# Patient Record
Sex: Female | Born: 1990 | Race: White | Hispanic: No | Marital: Married | Smoking: Never smoker
Health system: Southern US, Community
[De-identification: ages and names within clinical notes are randomized; demographics above are authoritative.]

## PROBLEM LIST (undated history)

## (undated) ENCOUNTER — Inpatient Hospital Stay (HOSPITAL_COMMUNITY): Payer: Self-pay

## (undated) DIAGNOSIS — N200 Calculus of kidney: Secondary | ICD-10-CM

## (undated) DIAGNOSIS — O139 Gestational [pregnancy-induced] hypertension without significant proteinuria, unspecified trimester: Secondary | ICD-10-CM

## (undated) DIAGNOSIS — Z789 Other specified health status: Secondary | ICD-10-CM

## (undated) HISTORY — PX: MOUTH SURGERY: SHX715

## (undated) HISTORY — DX: Gestational (pregnancy-induced) hypertension without significant proteinuria, unspecified trimester: O13.9

---
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
## 2010-05-11 ENCOUNTER — Inpatient Hospital Stay (HOSPITAL_COMMUNITY): Admission: AD | Admit: 2010-05-11 | Discharge: 2010-05-11 | Payer: Self-pay | Admitting: Obstetrics & Gynecology

## 2010-05-11 ENCOUNTER — Ambulatory Visit: Payer: Self-pay | Admitting: Advanced Practice Midwife

## 2010-08-27 ENCOUNTER — Inpatient Hospital Stay (HOSPITAL_COMMUNITY): Admission: AD | Admit: 2010-08-27 | Discharge: 2010-08-28 | Payer: Self-pay | Admitting: Obstetrics and Gynecology

## 2010-10-02 ENCOUNTER — Inpatient Hospital Stay (HOSPITAL_COMMUNITY): Admission: AD | Admit: 2010-10-02 | Discharge: 2010-10-02 | Payer: Self-pay | Admitting: Obstetrics and Gynecology

## 2010-10-07 ENCOUNTER — Inpatient Hospital Stay (HOSPITAL_COMMUNITY): Admission: AD | Admit: 2010-10-07 | Discharge: 2010-10-07 | Payer: Self-pay | Admitting: Obstetrics and Gynecology

## 2010-10-15 ENCOUNTER — Inpatient Hospital Stay (HOSPITAL_COMMUNITY)
Admission: AD | Admit: 2010-10-15 | Discharge: 2010-10-15 | Payer: Self-pay | Source: Home / Self Care | Admitting: Obstetrics and Gynecology

## 2010-10-24 ENCOUNTER — Inpatient Hospital Stay (HOSPITAL_COMMUNITY)
Admission: AD | Admit: 2010-10-24 | Discharge: 2010-10-24 | Payer: Self-pay | Source: Home / Self Care | Admitting: Obstetrics and Gynecology

## 2010-10-28 ENCOUNTER — Inpatient Hospital Stay (HOSPITAL_COMMUNITY): Admission: AD | Admit: 2010-10-28 | Discharge: 2010-10-30 | Payer: Self-pay | Admitting: Obstetrics & Gynecology

## 2011-02-07 LAB — CBC
HCT: 35.2 % — ABNORMAL LOW (ref 36.0–46.0)
Hemoglobin: 12 g/dL (ref 12.0–15.0)
MCH: 29.8 pg (ref 26.0–34.0)
MCH: 29.9 pg (ref 26.0–34.0)
MCHC: 33.3 g/dL (ref 30.0–36.0)
MCHC: 34 g/dL (ref 30.0–36.0)
MCV: 87.8 fL (ref 78.0–100.0)
MCV: 89.3 fL (ref 78.0–100.0)
Platelets: 224 10*3/uL (ref 150–400)
Platelets: 267 10*3/uL (ref 150–400)
RBC: 3.57 MIL/uL — ABNORMAL LOW (ref 3.87–5.11)
RBC: 4 MIL/uL (ref 3.87–5.11)
RDW: 14.3 % (ref 11.5–15.5)
WBC: 11.4 10*3/uL — ABNORMAL HIGH (ref 4.0–10.5)

## 2011-02-07 LAB — URINE MICROSCOPIC-ADD ON

## 2011-02-07 LAB — URINALYSIS, ROUTINE W REFLEX MICROSCOPIC
Bilirubin Urine: NEGATIVE
Glucose, UA: NEGATIVE mg/dL
Ketones, ur: NEGATIVE mg/dL
Nitrite: NEGATIVE
Protein, ur: NEGATIVE mg/dL
pH: 7.5 (ref 5.0–8.0)

## 2011-02-07 LAB — RPR: RPR Ser Ql: NONREACTIVE

## 2011-02-09 LAB — URINALYSIS, ROUTINE W REFLEX MICROSCOPIC
Glucose, UA: NEGATIVE mg/dL
Nitrite: NEGATIVE
Specific Gravity, Urine: 1.01 (ref 1.005–1.030)
pH: 6 (ref 5.0–8.0)

## 2011-02-09 LAB — URINE MICROSCOPIC-ADD ON

## 2011-02-09 LAB — URINE CULTURE

## 2011-02-12 LAB — POCT PREGNANCY, URINE: Preg Test, Ur: POSITIVE

## 2011-02-12 LAB — URINALYSIS, ROUTINE W REFLEX MICROSCOPIC
Ketones, ur: NEGATIVE mg/dL
Nitrite: NEGATIVE
Specific Gravity, Urine: 1.01 (ref 1.005–1.030)
pH: 7.5 (ref 5.0–8.0)

## 2011-03-04 LAB — URINALYSIS, ROUTINE W REFLEX MICROSCOPIC
Glucose, UA: NEGATIVE mg/dL
Ketones, ur: 15 mg/dL — AB
Protein, ur: NEGATIVE mg/dL

## 2011-03-04 LAB — URINE MICROSCOPIC-ADD ON

## 2011-03-04 LAB — RAPID STREP SCREEN (MED CTR MEBANE ONLY): Streptococcus, Group A Screen (Direct): NEGATIVE

## 2011-03-08 LAB — CBC
Hemoglobin: 11.2 g/dL — ABNORMAL LOW (ref 12.0–16.0)
MCHC: 34.1 g/dL (ref 31.0–37.0)
MCV: 91.3 fL (ref 78.0–98.0)
RBC: 3.61 MIL/uL — ABNORMAL LOW (ref 3.80–5.70)
RBC: 4 MIL/uL (ref 3.80–5.70)
WBC: 11.1 10*3/uL (ref 4.5–13.5)

## 2011-03-08 LAB — RH IMMUNE GLOB WKUP(>/=20WKS)(NOT WOMEN'S HOSP): Fetal Screen: NEGATIVE

## 2011-03-08 LAB — GLUCOSE, RANDOM: Glucose, Bld: 76 mg/dL (ref 70–99)

## 2011-03-08 LAB — RPR: RPR Ser Ql: NONREACTIVE

## 2011-03-14 LAB — COMPREHENSIVE METABOLIC PANEL
AST: 15 U/L (ref 0–37)
Albumin: 2.7 g/dL — ABNORMAL LOW (ref 3.5–5.2)
Chloride: 107 mEq/L (ref 96–112)
Creatinine, Ser: 0.39 mg/dL — ABNORMAL LOW (ref 0.4–1.2)
Potassium: 3.5 mEq/L (ref 3.5–5.1)
Total Bilirubin: 0.2 mg/dL — ABNORMAL LOW (ref 0.3–1.2)
Total Protein: 6 g/dL (ref 6.0–8.3)

## 2011-03-14 LAB — CBC
MCV: 90.4 fL (ref 78.0–98.0)
Platelets: 266 10*3/uL (ref 150–400)
RDW: 12.8 % (ref 11.4–15.5)
WBC: 9.6 10*3/uL (ref 4.5–13.5)

## 2011-03-14 LAB — URINALYSIS, ROUTINE W REFLEX MICROSCOPIC
Bilirubin Urine: NEGATIVE
Hgb urine dipstick: NEGATIVE
Protein, ur: NEGATIVE mg/dL
Urobilinogen, UA: 0.2 mg/dL (ref 0.0–1.0)

## 2011-03-14 LAB — WET PREP, GENITAL
Trich, Wet Prep: NONE SEEN
Yeast Wet Prep HPF POC: NONE SEEN

## 2011-03-14 LAB — DIFFERENTIAL
Basophils Absolute: 0 10*3/uL (ref 0.0–0.1)
Eosinophils Relative: 0 % (ref 0–5)
Lymphocytes Relative: 13 % — ABNORMAL LOW (ref 24–48)
Monocytes Absolute: 0.7 10*3/uL (ref 0.2–1.2)
Monocytes Relative: 7 % (ref 3–11)

## 2011-08-22 LAB — URINE MICROSCOPIC-ADD ON

## 2011-08-22 LAB — URINALYSIS, ROUTINE W REFLEX MICROSCOPIC
Bilirubin Urine: NEGATIVE
Glucose, UA: NEGATIVE
Protein, ur: 30 — AB

## 2012-11-07 ENCOUNTER — Inpatient Hospital Stay (HOSPITAL_COMMUNITY): Payer: BC Managed Care – PPO

## 2012-11-07 ENCOUNTER — Encounter (HOSPITAL_COMMUNITY): Payer: Self-pay

## 2012-11-07 ENCOUNTER — Inpatient Hospital Stay (HOSPITAL_COMMUNITY)
Admission: AD | Admit: 2012-11-07 | Discharge: 2012-11-07 | Disposition: A | Discharge status: Home / Self Care | Payer: BC Managed Care – PPO | Source: Ambulatory Visit | Attending: Obstetrics and Gynecology | Admitting: Obstetrics and Gynecology

## 2012-11-07 DIAGNOSIS — N390 Urinary tract infection, site not specified: Secondary | ICD-10-CM

## 2012-11-07 DIAGNOSIS — R110 Nausea: Secondary | ICD-10-CM | POA: Insufficient documentation

## 2012-11-07 DIAGNOSIS — R079 Chest pain, unspecified: Secondary | ICD-10-CM | POA: Insufficient documentation

## 2012-11-07 DIAGNOSIS — R42 Dizziness and giddiness: Secondary | ICD-10-CM | POA: Insufficient documentation

## 2012-11-07 DIAGNOSIS — R51 Headache: Secondary | ICD-10-CM | POA: Insufficient documentation

## 2012-11-07 DIAGNOSIS — N949 Unspecified condition associated with female genital organs and menstrual cycle: Secondary | ICD-10-CM | POA: Insufficient documentation

## 2012-11-07 HISTORY — DX: Other specified health status: Z78.9

## 2012-11-07 LAB — URINALYSIS, ROUTINE W REFLEX MICROSCOPIC
Ketones, ur: NEGATIVE mg/dL
Nitrite: NEGATIVE
Protein, ur: NEGATIVE mg/dL
pH: 6.5 (ref 5.0–8.0)

## 2012-11-07 MED ORDER — CIPROFLOXACIN HCL 500 MG PO TABS: 500.0000 mg | ORAL_TABLET | Freq: Two times a day (BID) | ORAL | Status: DC

## 2012-11-07 NOTE — MAU Provider Note (Signed)
History     CSN: [id number]  Arrival date and time: 11/07/12 1757   None     Chief Complaint  Patient presents with  . Pelvic Pain  . Vaginal Bleeding  . Chest Pain   HPI This is a 21 y.o. female who presents with c/o rib pain for two months, mostly on Right, 11-12th ribs.  Does not hurt more with deep breath. Hurts mostly with touch or sometimes movement. Denies asthma or other respiratory problems. Also c/o nausea for one week and lower abdominal pain which feels like "poking" in the vagina. Denies vomiting or diarrhea, Denies fever. Also has some dizziness and headache. She read that these are all side effects of the IUD.  States has had some vaginal bleeding for past month or two. Has not spoken with Dr Henderson Cloud or her family MD about any of these problems   RN Note: Pt states that pain started around 2 months ago and is in her ribs and abdomen. States that it comes and goes. States she also gets dizzy and lightheaded with the pain      OB History    Grav Para Term Preterm Abortions TAB SAB Ect Mult Living   2 2 2       2       Past Medical History  Diagnosis Date  . No pertinent past medical history     Past Surgical History  Procedure Date  . Mouth surgery     Family History  Problem Relation Age of Onset  . Cancer Mother   . Heart disease Mother   . Diabetes Mother     History  Substance Use Topics  . Smoking status: Never Smoker   . Smokeless tobacco: Not on file  . Alcohol Use: No    Allergies:  Allergies  Allergen Reactions  . Augmentin (Amoxicillin-Pot Clavulanate) Nausea And Vomiting    No prescriptions prior to admission    ROS See HPI  Physical Exam   Blood pressure 127/79, pulse 103, temperature 97.8 F (36.6 C), temperature source Oral, resp. rate 20.  Physical Exam  Constitutional: She is oriented to person, place, and time. She appears well-developed and well-nourished. No distress.  HENT:  Head: Normocephalic.   Cardiovascular: Normal rate, regular rhythm and normal heart sounds.   Respiratory: Effort normal and breath sounds normal. No respiratory distress. She has no wheezes. She has no rales. She exhibits tenderness (over Right 11-12th ICS, none on left).  GI: Soft.  Genitourinary: Uterus normal. Vaginal discharge (white mucous) found.       IUD strings visible, no protrusion palpable, Cervix long and closed. Has some CMT Refuses STD testing.  Musculoskeletal: Normal range of motion.  Neurological: She is alert and oriented to person, place, and time.  Skin: Skin is warm and dry.  Psychiatric: She has a normal mood and affect.   Results for orders placed during the hospital encounter of 11/07/12 (from the past 24 hour(s))  URINALYSIS, ROUTINE W REFLEX MICROSCOPIC     Status: Abnormal   Collection Time   11/07/12  6:30 PM      Component Value Range   Color, Urine YELLOW  YELLOW   APPearance CLEAR  CLEAR   Specific Gravity, Urine 1.020  1.005 - 1.030   pH 6.5  5.0 - 8.0   Glucose, UA NEGATIVE  NEGATIVE mg/dL   Hgb urine dipstick MODERATE (*) NEGATIVE   Bilirubin Urine NEGATIVE  NEGATIVE   Ketones, ur NEGATIVE  NEGATIVE mg/dL  Protein, ur NEGATIVE  NEGATIVE mg/dL   Urobilinogen, UA 0.2  0.0 - 1.0 mg/dL   Nitrite NEGATIVE  NEGATIVE   Leukocytes, UA LARGE (*) NEGATIVE  URINE MICROSCOPIC-ADD ON     Status: Abnormal   Collection Time   11/07/12  6:30 PM      Component Value Range   Squamous Epithelial / LPF FEW (*) RARE   WBC, UA 21-50  <3 WBC/hpf   RBC / HPF 11-20  <3 RBC/hpf   Bacteria, UA FEW (*) RARE  POCT PREGNANCY, URINE     Status: Normal   Collection Time   11/07/12  7:01 PM      Component Value Range   Preg Test, Ur NEGATIVE  NEGATIVE    MAU Course  Procedures  MDM Dr Henderson Cloud paged at 1950hrs.  Assessment and Plan  A:  Pelvic pain      Right rib pain      Nausea for one week      Probable UTI  P:  Will culture urine and treat presumptively for UTI       Will  check IUD with [place]  [facility] 11/07/2012, 7:49 PM   Addendum Assumed care at 2130 Clinical Data: Pelvic pain. IUD placement.  TRANSABDOMINAL AND TRANSVAGINAL ULTRASOUND OF PELVIS  Technique: Both transabdominal and transvaginal ultrasound  examinations of the pelvis were performed. Transabdominal technique  was performed for global imaging of the pelvis including uterus,  ovaries, adnexal regions, and pelvic cul-de-sac.  It was necessary to proceed with endovaginal exam following the  transabdominal exam to visualize the endometrium and ovaries.  Comparison: None  Findings:  Uterus: The uterus is anteverted and measures 8.1 x 3.7 x 5.1 cm.  No focal myometrial mass lesions.  Endometrium: Endometrial stripe thickness measures 4 mm. The  echogenic structure within the endometrium consistent with  intrauterine device. Positioning of the intrauterine device  appears typical. No abnormal endometrial fluid collections.  Right ovary: The right ovary measures 4.2 x 3 x 3.3 cm. Normal  follicular changes. Flow is demonstrated in the right ovary on  color flow Doppler imaging.  Left ovary: Left ovary measures 3.6 x 1.7 x 1.8 cm. Normal  follicular changes. Flow is demonstrated in the left ovary on  color flow Doppler imaging.  Other findings: No free pelvic fluid collections. No abnormal  adnexal masses.  IMPRESSION:  Intrauterine device is present in typical location. Otherwise  normal study. No evidence of pelvic mass or other significant  abnormality.  Original Report Authenticated By: Burman Nieves, M.D.   P:  Reassured pt re IUD placement and normal pelvic US Rx: Cipro x3d AVS on UTI F/U as needed with Dr. Aldona Bar

## 2012-11-07 NOTE — MAU Note (Signed)
Pt states that pain started around 2 months ago and is in her ribs and abdomen. States that it comes and goes. States she also gets dizzy and lightheaded with the pain

## 2012-11-09 LAB — URINE CULTURE
Culture: NO GROWTH
Special Requests: NORMAL

## 2013-05-23 ENCOUNTER — Encounter (HOSPITAL_COMMUNITY): Payer: Self-pay | Admitting: Emergency Medicine

## 2013-05-23 DIAGNOSIS — M549 Dorsalgia, unspecified: Secondary | ICD-10-CM | POA: Insufficient documentation

## 2013-05-23 DIAGNOSIS — R300 Dysuria: Secondary | ICD-10-CM | POA: Insufficient documentation

## 2013-05-23 DIAGNOSIS — R350 Frequency of micturition: Secondary | ICD-10-CM | POA: Insufficient documentation

## 2013-05-23 DIAGNOSIS — Z3202 Encounter for pregnancy test, result negative: Secondary | ICD-10-CM | POA: Insufficient documentation

## 2013-05-23 DIAGNOSIS — N390 Urinary tract infection, site not specified: Secondary | ICD-10-CM | POA: Insufficient documentation

## 2013-05-23 LAB — URINALYSIS, ROUTINE W REFLEX MICROSCOPIC
Bilirubin Urine: NEGATIVE
Ketones, ur: NEGATIVE mg/dL
Nitrite: NEGATIVE
Protein, ur: 30 mg/dL — AB
Urobilinogen, UA: 1 mg/dL (ref 0.0–1.0)

## 2013-05-23 LAB — URINE MICROSCOPIC-ADD ON

## 2013-05-23 NOTE — ED Notes (Signed)
C/o intermittent bilateral flank pain since Wednesday and vaginal pressure after urinating.  Reports mid lower back pain today.

## 2013-05-24 ENCOUNTER — Emergency Department (HOSPITAL_COMMUNITY)
Admission: EM | Admit: 2013-05-24 | Discharge: 2013-05-24 | Disposition: A | Discharge status: Home / Self Care | Payer: BC Managed Care – PPO | Attending: Emergency Medicine | Admitting: Emergency Medicine

## 2013-05-24 ENCOUNTER — Emergency Department (HOSPITAL_COMMUNITY): Payer: BC Managed Care – PPO

## 2013-05-24 DIAGNOSIS — M549 Dorsalgia, unspecified: Secondary | ICD-10-CM

## 2013-05-24 DIAGNOSIS — N390 Urinary tract infection, site not specified: Secondary | ICD-10-CM

## 2013-05-24 MED ORDER — NITROFURANTOIN MONOHYD MACRO 100 MG PO CAPS: 100.0000 mg | ORAL_CAPSULE | Freq: Two times a day (BID) | ORAL | Status: DC

## 2013-05-24 MED ORDER — NITROFURANTOIN MONOHYD MACRO 100 MG PO CAPS
100.0000 mg | ORAL_CAPSULE | Freq: Once | ORAL | Status: AC
  Administered 2013-05-24: 100 mg via ORAL
  Filled 2013-05-24: qty 1

## 2013-05-24 MED ORDER — IBUPROFEN 800 MG PO TABS: 800.0000 mg | ORAL_TABLET | Freq: Three times a day (TID) | ORAL | Status: DC

## 2013-05-24 MED ORDER — KETOROLAC TROMETHAMINE 60 MG/2ML IM SOLN
60.0000 mg | Freq: Once | INTRAMUSCULAR | Status: AC
  Administered 2013-05-24: 60 mg via INTRAMUSCULAR
  Filled 2013-05-24: qty 2

## 2013-05-24 NOTE — ED Provider Notes (Addendum)
History    CSN: [id number] Arrival date & time 05/23/13  2255  First MD Initiated Contact with Patient 05/24/13 0100     Chief Complaint  Patient presents with  . Flank Pain  . Back Pain   (Consider location/radiation/quality/duration/timing/severity/associated sxs/prior Treatment) HPI Hx per PT - L flank pain since yesterday with associated dysuria and frequency. No F/C. No N/V/D. No h/o same, no hematuria but has noticed dark urine. Symptoms MOD in severity.   Past Medical History  Diagnosis Date  . No pertinent past medical history    Past Surgical History  Procedure Laterality Date  . Mouth surgery     Family History  Problem Relation Age of Onset  . Cancer Mother   . Heart disease Mother   . Diabetes Mother    History  Substance Use Topics  . Smoking status: Never Smoker   . Smokeless tobacco: Not on file  . Alcohol Use: No   OB History   Grav Para Term Preterm Abortions TAB SAB Ect Mult Living   2 2 2       2      Review of Systems  Constitutional: Negative for fever and chills.  HENT: Negative for neck pain and neck stiffness.   Eyes: Negative for pain.  Respiratory: Negative for shortness of breath.   Cardiovascular: Negative for chest pain.  Gastrointestinal: Negative for abdominal pain.  Genitourinary: Positive for flank pain.  Musculoskeletal: Negative for back pain.  Skin: Negative for rash.  Neurological: Negative for headaches.  All other systems reviewed and are negative.    Allergies  Augmentin  Home Medications   Current Outpatient Rx  Name  Route  Sig  Dispense  Refill  . levonorgestrel (MIRENA) 20 MCG/24HR IUD   Intrauterine   1 each by Intrauterine route once.          BP 145/94  Pulse 104  Temp(Src) 98.4 F (36.9 C) (Oral)  Resp 16  SpO2 97% Physical Exam  Constitutional: She is oriented to person, place, and time. She appears well-developed and well-nourished.  HENT:  Head: Normocephalic and atraumatic.  Eyes: EOM  are normal. Pupils are equal, round, and reactive to light.  Neck: Neck supple.  Cardiovascular: Normal rate, regular rhythm and intact distal pulses.   Pulmonary/Chest: Effort normal and breath sounds normal. No respiratory distress.  Abdominal: Soft. Bowel sounds are normal. She exhibits no distension. There is no tenderness.  No CVAT  Musculoskeletal: Normal range of motion. She exhibits no edema.  Neurological: She is alert and oriented to person, place, and time.  Skin: Skin is warm and dry.    ED Course  Procedures (including critical care time)  Results for orders placed during the hospital encounter of 05/24/13  URINALYSIS, ROUTINE W REFLEX MICROSCOPIC      Result Value Range   Color, Urine YELLOW  YELLOW   APPearance CLOUDY (*) CLEAR   Specific Gravity, Urine 1.027  1.005 - 1.030   pH 6.0  5.0 - 8.0   Glucose, UA NEGATIVE  NEGATIVE mg/dL   Hgb urine dipstick MODERATE (*) NEGATIVE   Bilirubin Urine NEGATIVE  NEGATIVE   Ketones, ur NEGATIVE  NEGATIVE mg/dL   Protein, ur 30 (*) NEGATIVE mg/dL   Urobilinogen, UA 1.0  0.0 - 1.0 mg/dL   Nitrite NEGATIVE  NEGATIVE   Leukocytes, UA LARGE (*) NEGATIVE  URINE MICROSCOPIC-ADD ON      Result Value Range   Squamous Epithelial / LPF RARE  RARE  WBC, UA 21-50  <3 WBC/hpf   RBC / HPF 0-2  <3 RBC/hpf   Bacteria, UA RARE  RARE  POCT PREGNANCY, URINE      Result Value Range   Preg Test, Ur NEGATIVE  NEGATIVE   Ct Abdomen Pelvis Wo Contrast  05/24/2013   *RADIOLOGY REPORT*  Clinical Data:  Intermittent bilateral flank pain since Wednesday. Vaginal pressure after urinating.  Left flank pain.  CT ABDOMEN AND PELVIS WITHOUT CONTRAST (CT UROGRAM)  Technique: Contiguous axial images of the abdomen and pelvis without oral or intravenous contrast were obtained.  Comparison: Pelvic ultrasound 11/07/2012.  No prior CT.  Findings:  Exam is limited for evaluation of entities other than urinary tract calculi due to lack of oral or intravenous  contrast.   Lung bases:  Normal  Abdomen/pelvis:  Normal uninfused appearance of the liver, spleen, stomach, pancreas, gallbladder, biliary tract, adrenal glands.  Bilateral renal collecting system calculi.  Low density foci within both kidneys which are likely cysts. No hydronephrosis.  No hydroureter or ureteric calculi.  No retroperitoneal or retrocrural adenopathy.  Normal terminal ileum and appendix.  Normal small bowel without abdominal ascites.  No pelvic adenopathy.  Normal urinary bladder.  Intrauterine device. No adnexal mass or significant free fluid.  Bones/Musculoskeletal:  No acute osseous abnormality.  IMPRESSION:  1.  Bilateral nephrolithiasis.  No urinary tract obstruction or ureteric stone. 2.  Low-density renal lesions which are likely cysts.   Original Report Authenticated By: Jeronimo Greaves, M.D.    toradol and first dose ABx provided  Plan d/c home, UTI precautions RX ABx and outpatient follow up  MDM  UTI  Labs, imaging, UA  Medications provided  VS, nursing notes reviewed  Sunnie Nielsen, MD 05/24/13 [id number]  Sunnie Nielsen, MD 06/04/13 5409

## 2013-07-11 ENCOUNTER — Ambulatory Visit: Payer: Self-pay | Admitting: Family Medicine

## 2013-07-14 ENCOUNTER — Ambulatory Visit (INDEPENDENT_AMBULATORY_CARE_PROVIDER_SITE_OTHER): Payer: BC Managed Care – PPO | Admitting: Family Medicine

## 2013-07-14 ENCOUNTER — Encounter: Payer: Self-pay | Admitting: Family Medicine

## 2013-07-14 VITALS — BP 100/68 | HR 78 | Temp 98.6°F | Resp 16 | Wt 118.0 lb

## 2013-07-14 DIAGNOSIS — J020 Streptococcal pharyngitis: Secondary | ICD-10-CM

## 2013-07-14 LAB — RAPID STREP SCREEN (MED CTR MEBANE ONLY): Streptococcus, Group A Screen (Direct): NEGATIVE

## 2013-07-14 MED ORDER — AZITHROMYCIN 250 MG PO TABS: ORAL_TABLET | ORAL | Status: DC

## 2013-07-14 NOTE — Progress Notes (Signed)
  Subjective:    Patient ID: Jill Vaughn, female    DOB: 08/03/1991, 22 y.o.   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  She now presents with days of severe sore throat on the left side. Left otalgia. Left tender cervical lymphadenopathy. Subjective fevers. She denies any rhinorrhea or coughing. She denies any nausea vomiting or diarrhea. She denies any rash. Past Medical History  Diagnosis Date  . No pertinent past medical history    Current Outpatient Prescriptions on File Prior to Visit  Medication Sig Dispense Refill  . levonorgestrel (MIRENA) 20 MCG/24HR IUD 1 each by Intrauterine route once.       No current facility-administered medications on file prior to visit.   Allergies  Allergen Reactions  . Augmentin [Amoxicillin-Pot Clavulanate] Nausea And Vomiting   History   Social History  . Marital Status: Legally Separated    Spouse Name: N/A    Number of Children: N/A  . Years of Education: N/A   Occupational History  . Not on file.   Social History Main Topics  . Smoking status: Never Smoker   . Smokeless tobacco: Not on file  . Alcohol Use: No  . Drug Use: No  . Sexual Activity: Yes    Birth Control/ Protection: IUD   Other Topics Concern  . Not on file   Social History Narrative  . No narrative on file      Review of Systems  All other systems reviewed and are negative.       Objective:   Physical Exam  Vitals reviewed. HENT:  Right Ear: External ear normal.  Left Ear: External ear normal.  Nose: Nose normal.  Mouth/Throat: Oropharyngeal exudate, posterior oropharyngeal edema and posterior oropharyngeal erythema present.  Neck: Neck supple. No thyromegaly present.  Cardiovascular: Normal rate, regular rhythm and normal heart sounds.   No murmur heard. Pulmonary/Chest: Effort normal and breath sounds normal. No respiratory distress. She has no wheezes. She has no rales.   Abdominal: Soft. Bowel sounds are normal. She exhibits no distension. There is no tenderness. There is no rebound.  Lymphadenopathy:    She has cervical adenopathy (Tender left anterior cervical lymphadenopathy).   Left tonsil is red and swollen.       Assessment & Plan:  Clinically the patient has strep throat.- Begin azithromycin 500 mg by mouth daily 1 and 250 mg by mouth daily 2-5. Recheck in 48 hours if no better or sooner if worse.

## 2014-07-19 ENCOUNTER — Emergency Department (HOSPITAL_COMMUNITY)
Admission: EM | Admit: 2014-07-19 | Discharge: 2014-07-19 | Disposition: A | Discharge status: Home / Self Care | Payer: BC Managed Care – PPO | Attending: Emergency Medicine | Admitting: Emergency Medicine

## 2014-07-19 ENCOUNTER — Encounter (HOSPITAL_COMMUNITY): Payer: Self-pay | Admitting: Emergency Medicine

## 2014-07-19 DIAGNOSIS — N760 Acute vaginitis: Secondary | ICD-10-CM | POA: Diagnosis not present

## 2014-07-19 DIAGNOSIS — Z3202 Encounter for pregnancy test, result negative: Secondary | ICD-10-CM | POA: Diagnosis not present

## 2014-07-19 DIAGNOSIS — B9689 Other specified bacterial agents as the cause of diseases classified elsewhere: Secondary | ICD-10-CM | POA: Insufficient documentation

## 2014-07-19 DIAGNOSIS — R102 Pelvic and perineal pain: Secondary | ICD-10-CM

## 2014-07-19 DIAGNOSIS — N949 Unspecified condition associated with female genital organs and menstrual cycle: Secondary | ICD-10-CM | POA: Insufficient documentation

## 2014-07-19 DIAGNOSIS — Z87442 Personal history of urinary calculi: Secondary | ICD-10-CM | POA: Insufficient documentation

## 2014-07-19 DIAGNOSIS — R109 Unspecified abdominal pain: Secondary | ICD-10-CM | POA: Insufficient documentation

## 2014-07-19 DIAGNOSIS — A499 Bacterial infection, unspecified: Secondary | ICD-10-CM | POA: Insufficient documentation

## 2014-07-19 DIAGNOSIS — Z880 Allergy status to penicillin: Secondary | ICD-10-CM | POA: Insufficient documentation

## 2014-07-19 HISTORY — DX: Calculus of kidney: N20.0

## 2014-07-19 LAB — URINALYSIS, ROUTINE W REFLEX MICROSCOPIC
Bilirubin Urine: NEGATIVE
GLUCOSE, UA: NEGATIVE mg/dL
KETONES UR: NEGATIVE mg/dL
Nitrite: NEGATIVE
PH: 5.5 (ref 5.0–8.0)
PROTEIN: NEGATIVE mg/dL
Specific Gravity, Urine: >1.03 — ABNORMAL HIGH (ref 1.005–1.030)
Urobilinogen, UA: 0.2 mg/dL (ref 0.0–1.0)

## 2014-07-19 LAB — URINE MICROSCOPIC-ADD ON

## 2014-07-19 LAB — CBC WITH DIFFERENTIAL/PLATELET
Basophils Absolute: 0 10*3/uL (ref 0.0–0.1)
Basophils Relative: 0 % (ref 0–1)
Eosinophils Absolute: 0 10*3/uL (ref 0.0–0.7)
Eosinophils Relative: 0 % (ref 0–5)
HEMATOCRIT: 41.2 % (ref 36.0–46.0)
HEMOGLOBIN: 14.2 g/dL (ref 12.0–15.0)
LYMPHS PCT: 17 % (ref 12–46)
Lymphs Abs: 1.1 10*3/uL (ref 0.7–4.0)
MCH: 31 pg (ref 26.0–34.0)
MCHC: 34.5 g/dL (ref 30.0–36.0)
MCV: 90 fL (ref 78.0–100.0)
MONO ABS: 0.5 10*3/uL (ref 0.1–1.0)
MONOS PCT: 7 % (ref 3–12)
NEUTROS ABS: 4.9 10*3/uL (ref 1.7–7.7)
Neutrophils Relative %: 76 % (ref 43–77)
Platelets: 248 10*3/uL (ref 150–400)
RBC: 4.58 MIL/uL (ref 3.87–5.11)
RDW: 12.3 % (ref 11.5–15.5)
WBC: 6.5 10*3/uL (ref 4.0–10.5)

## 2014-07-19 LAB — COMPREHENSIVE METABOLIC PANEL
ALT: 12 U/L (ref 0–35)
ANION GAP: 12 (ref 5–15)
AST: 14 U/L (ref 0–37)
Albumin: 4 g/dL (ref 3.5–5.2)
Alkaline Phosphatase: 83 U/L (ref 39–117)
BUN: 9 mg/dL (ref 6–23)
CO2: 25 mEq/L (ref 19–32)
Calcium: 9.3 mg/dL (ref 8.4–10.5)
Chloride: 104 mEq/L (ref 96–112)
Creatinine, Ser: 0.7 mg/dL (ref 0.50–1.10)
GFR calc non Af Amer: >90 mL/min (ref >90)
GLUCOSE: 85 mg/dL (ref 70–99)
Potassium: 4.3 mEq/L (ref 3.7–5.3)
Sodium: 141 mEq/L (ref 137–147)
Total Bilirubin: 0.4 mg/dL (ref 0.3–1.2)
Total Protein: 7.3 g/dL (ref 6.0–8.3)

## 2014-07-19 LAB — WET PREP, GENITAL
Trich, Wet Prep: NONE SEEN
YEAST WET PREP: NONE SEEN

## 2014-07-19 LAB — PREGNANCY, URINE: Preg Test, Ur: NEGATIVE

## 2014-07-19 LAB — LIPASE, BLOOD: Lipase: 57 U/L (ref 11–59)

## 2014-07-19 MED ORDER — KETOROLAC TROMETHAMINE 60 MG/2ML IM SOLN
60.0000 mg | Freq: Once | INTRAMUSCULAR | Status: AC
  Administered 2014-07-19: 60 mg via INTRAMUSCULAR
  Filled 2014-07-19: qty 2

## 2014-07-19 MED ORDER — CEFTRIAXONE SODIUM 250 MG IJ SOLR
250.0000 mg | Freq: Once | INTRAMUSCULAR | Status: AC
  Administered 2014-07-19: 250 mg via INTRAMUSCULAR
  Filled 2014-07-19: qty 250

## 2014-07-19 MED ORDER — ONDANSETRON HCL 4 MG PO TABS
4.0000 mg | ORAL_TABLET | Freq: Once | ORAL | Status: AC
  Administered 2014-07-19: 4 mg via ORAL
  Filled 2014-07-19: qty 1

## 2014-07-19 MED ORDER — ONDANSETRON HCL 4 MG PO TABS: 4.0000 mg | ORAL_TABLET | Freq: Four times a day (QID) | ORAL | Status: DC

## 2014-07-19 MED ORDER — METRONIDAZOLE 500 MG PO TABS: 500.0000 mg | ORAL_TABLET | Freq: Two times a day (BID) | ORAL | Status: DC

## 2014-07-19 MED ORDER — LIDOCAINE HCL (PF) 1 % IJ SOLN
INTRAMUSCULAR | Status: AC
  Administered 2014-07-19: 18:00:00
  Filled 2014-07-19: qty 5

## 2014-07-19 MED ORDER — NAPROXEN 500 MG PO TABS: 500.0000 mg | ORAL_TABLET | Freq: Two times a day (BID) | ORAL | Status: DC

## 2014-07-19 NOTE — Discharge Instructions (Signed)
Take the metronidazole until gone. Take the naproxen for pain as needed. Use the zofran for nausea or vomiting if needed. Have Dr Aldona Bar recheck you this week if you aren't improving.  Bacterial Vaginosis Bacterial vaginosis is a vaginal infection that occurs when the normal balance of bacteria in the vagina is disrupted. It results from an overgrowth of certain bacteria. This is the most common vaginal infection in women of childbearing age. Treatment is important to prevent complications, especially in pregnant women, as it can cause a premature delivery. CAUSES  Bacterial vaginosis is caused by an increase in harmful bacteria that are normally present in smaller amounts in the vagina. Several different kinds of bacteria can cause bacterial vaginosis. However, the reason that the condition develops is not fully understood. RISK FACTORS Certain activities or behaviors can put you at an increased risk of developing bacterial vaginosis, including:  Having a new sex partner or multiple sex partners.  Douching.  Using an intrauterine device (IUD) for contraception. Women do not get bacterial vaginosis from toilet seats, bedding, swimming pools, or contact with objects around them. SIGNS AND SYMPTOMS  Some women with bacterial vaginosis have no signs or symptoms. Common symptoms include:  Grey vaginal discharge.  A fishlike odor with discharge, especially after sexual intercourse.  Itching or burning of the vagina and vulva.  Burning or pain with urination. DIAGNOSIS  Your health care provider will take a medical history and examine the vagina for signs of bacterial vaginosis. A sample of vaginal fluid may be taken. Your health care provider will look at this sample under a microscope to check for bacteria and abnormal cells. A vaginal pH test may also be done.  TREATMENT  Bacterial vaginosis may be treated with antibiotic medicines. These may be given in the form of a pill or a vaginal cream. A  second round of antibiotics may be prescribed if the condition comes back after treatment.  HOME CARE INSTRUCTIONS   Only take over-the-counter or prescription medicines as directed by your health care provider.  If antibiotic medicine was prescribed, take it as directed. Make sure you finish it even if you start to feel better.  Do not have sex until treatment is completed.  Tell all sexual partners that you have a vaginal infection. They should see their health care provider and be treated if they have problems, such as a mild rash or itching.  Practice safe sex by using condoms and only having one sex partner. SEEK MEDICAL CARE IF:   Your symptoms are not improving after 3 days of treatment.  You have increased discharge or pain.  You have a fever. MAKE SURE YOU:   Understand these instructions.  Will watch your condition.  Will get help right away if you are not doing well or get worse. FOR MORE INFORMATION  Centers for Disease Control and Prevention, Division of STD Prevention: [employer]Apps[employer] American Sexual Health Association (ASHA): www.ashastd.org  Document Released: 11/13/2005 Document Revised: 09/03/2013 Document Reviewed: 06/25/2013 [facility] Patient Information 2015 [place], [place]. This information is not intended to replace advice given to you by your health care provider. Make sure you discuss any questions you have with your health care provider.

## 2014-07-19 NOTE — ED Provider Notes (Signed)
CSN: [id number]     Arrival date & time 07/19/14  1551 History   First MD Initiated Contact with Patient 07/19/14 1600     Chief Complaint  Patient presents with  . Abdominal Pain     (Consider location/radiation/quality/duration/timing/severity/associated sxs/prior Treatment) HPI Patient is G2 P2, no regular menses because of IUD placed in 2011. She reports she's been having some intermittent right lower quadrant pain off and on for the past month. She states the pain comes and lasts about 3 minutes. She states only thing she notices that makes the pain worse is driving. Nothing makes it feel better. She states when she gets the pain she wants to lay down and be still. She describes the pain as pressure and aching. She does not get the pain every day however she can have it over 20 times a day. She sometimes has nausea with it but no vomiting. She has noted frequency without dysuria or hematuria. She denies diarrhea. She has had some vaginal discharge. She denies fever or cough. She states the current pain she has started about 12 noon today and has not gone away. She rates the pain as a 6/10. She states she also gets some achiness in her right flank that is not associated with the pain in her abdomen. She states she's had this pain before with kidney stones.  PCP Dr Tanya Nones GYN Dr Aldona Bar  Past Medical History  Diagnosis Date  . No pertinent past medical history   . Kidney stones    Past Surgical History  Procedure Laterality Date  . Mouth surgery     Family History  Problem Relation Age of Onset  . Cancer Mother   . Heart disease Mother   . Diabetes Mother    History  Substance Use Topics  . Smoking status: Never Smoker   . Smokeless tobacco: Not on file  . Alcohol Use: No   unemployed  OB History   Grav Para Term Preterm Abortions TAB SAB Ect Mult Living   Review of Systems  All other systems reviewed and are negative.     Allergies   Augmentin  Home Medications   Prior to Admission medications   Medication Sig Start Date End Date Taking? Authorizing Provider  levonorgestrel (MIRENA) 20 MCG/24HR IUD 1 each by Intrauterine route once.    Historical Provider, MD   BP 117/76  Pulse 104  Temp(Src) 99.1 F (37.3 C) (Oral)  Resp 18  Ht 5' (1.524 m)  Wt 130 lb (58.968 kg)  BMI 25.39 kg/m2  SpO2 100%  Vital signs normal except for tachycardia, low grade fever  Physical Exam  Nursing note and vitals reviewed. Constitutional: She is oriented to person, place, and time. She appears well-developed and well-nourished.  Non-toxic appearance. She does not appear ill. No distress.  HENT:  Head: Normocephalic and atraumatic.  Right Ear: External ear normal.  Left Ear: External ear normal.  Nose: Nose normal. No mucosal edema or rhinorrhea.  Mouth/Throat: Oropharynx is clear and moist and mucous membranes are normal. No dental abscesses or uvula swelling.  Eyes: Conjunctivae and EOM are normal. Pupils are equal, round, and reactive to light.  Neck: Normal range of motion and full passive range of motion without pain. Neck supple.  Cardiovascular: Normal rate, regular rhythm and normal heart sounds.  Exam reveals no gallop and no friction rub.   No murmur heard. Pulmonary/Chest: Effort normal  and breath sounds normal. No respiratory distress. She has no wheezes. She has no rhonchi. She has no rales. She exhibits no tenderness and no crepitus.  Abdominal: Soft. Normal appearance and bowel sounds are normal. She exhibits no distension. There is tenderness. There is no rebound and no guarding.    Very tender in the low RLQ Bilateral flank pain  Genitourinary:  White vaginal discharge. Speculum exam is painful. IUD string was seen coming out of the cervical os. Patient is tender to palpation upper normal size uterus, she's very tender over the left adnexa but has no discomfort over the right adnexa.  Musculoskeletal: Normal  range of motion. She exhibits no edema and no tenderness.  Moves all extremities well.   Neurological: She is alert and oriented to person, place, and time. She has normal strength. No cranial nerve deficit.  Skin: Skin is warm, dry and intact. No rash noted. No erythema. No pallor.  Psychiatric: She has a normal mood and affect. Her speech is normal and behavior is normal. Her mood appears not anxious.    ED Course  Procedures (including critical care time)  Medications  ketorolac (TORADOL) injection 60 mg (60 mg Intramuscular Given 07/19/14 1638)  ondansetron (ZOFRAN) tablet 4 mg (4 mg Oral Given 07/19/14 1638)  cefTRIAXone (ROCEPHIN) injection 250 mg (250 mg Intramuscular Given 07/19/14 1736)  lidocaine (PF) (XYLOCAINE) 1 % injection (  Given 07/19/14 1738)    Review of prior studies shows patient had abdominal/pelvic CT scan done in June 2014 showing small bilateral renal stones without stones in the ureter or bladder.  Pt given toradol for pain and rocephin b/o discomfort with pelvic exam. Pt reports she has had dyspareunia also.   Labs Review Results for orders placed during the hospital encounter of 07/19/14  WET PREP, GENITAL      Result Value Ref Range   Yeast Wet Prep HPF POC NONE SEEN  NONE SEEN   Trich, Wet Prep NONE SEEN  NONE SEEN   Clue Cells Wet Prep HPF POC FEW (*) NONE SEEN   WBC, Wet Prep HPF POC TOO NUMEROUS TO COUNT (*) NONE SEEN  URINALYSIS, ROUTINE W REFLEX MICROSCOPIC      Result Value Ref Range   Color, Urine YELLOW  YELLOW   APPearance CLOUDY (*) CLEAR   Specific Gravity, Urine >1.030 (*) 1.005 - 1.030   pH 5.5  5.0 - 8.0   Glucose, UA NEGATIVE  NEGATIVE mg/dL   Hgb urine dipstick LARGE (*) NEGATIVE   Bilirubin Urine NEGATIVE  NEGATIVE   Ketones, ur NEGATIVE  NEGATIVE mg/dL   Protein, ur NEGATIVE  NEGATIVE mg/dL   Urobilinogen, UA 0.2  0.0 - 1.0 mg/dL   Nitrite NEGATIVE  NEGATIVE   Leukocytes, UA SMALL (*) NEGATIVE  PREGNANCY, URINE      Result Value  Ref Range   Preg Test, Ur NEGATIVE  NEGATIVE  CBC WITH DIFFERENTIAL      Result Value Ref Range   WBC 6.5  4.0 - 10.5 K/uL   RBC 4.58  3.87 - 5.11 MIL/uL   Hemoglobin 14.2  12.0 - 15.0 g/dL   HCT 16.1  09.6 - 04.5 %   MCV 90.0  78.0 - 100.0 fL   MCH 31.0  26.0 - 34.0 pg   MCHC 34.5  30.0 - 36.0 g/dL   RDW 40.9  81.1 - 91.4 %   Platelets 248  150 - 400 K/uL   Neutrophils Relative % 76  43 -  77 %   Neutro Abs 4.9  1.7 - 7.7 K/uL   Lymphocytes Relative 17  12 - 46 %   Lymphs Abs 1.1  0.7 - 4.0 K/uL   Monocytes Relative 7  3 - 12 %   Monocytes Absolute 0.5  0.1 - 1.0 K/uL   Eosinophils Relative 0  0 - 5 %   Eosinophils Absolute 0.0  0.0 - 0.7 K/uL   Basophils Relative 0  0 - 1 %   Basophils Absolute 0.0  0.0 - 0.1 K/uL  URINE MICROSCOPIC-ADD ON      Result Value Ref Range   Squamous Epithelial / LPF FEW (*) RARE   WBC, UA 3-6  <3 WBC/hpf   RBC / HPF 11-20  <3 RBC/hpf   Bacteria, UA FEW (*) RARE    Laboratory interpretation all normal except BV, ? UTI but is voided sample   Imaging Review No results found.   EKG Interpretation None      MDM   Final diagnoses:  BV (bacterial vaginosis)  Pelvic pain in female   New Prescriptions   METRONIDAZOLE (FLAGYL) 500 MG TABLET    Take 1 tablet (500 mg total) by mouth 2 (two) times daily.   NAPROXEN (NAPROSYN) 500 MG TABLET    Take 1 tablet (500 mg total) by mouth 2 (two) times daily with a meal.   ONDANSETRON (ZOFRAN) 4 MG TABLET    Take 1 tablet (4 mg total) by mouth every 6 (six) hours.     Plan discharge   Devoria Albe, MD, Franz Dell, MD 07/19/14 [phone]

## 2014-07-19 NOTE — ED Notes (Signed)
Pt c/o intermittent right side abd pain for the past month, pain is associated with nausea at times,

## 2014-07-20 LAB — HIV ANTIBODY (ROUTINE TESTING W REFLEX): HIV 1&2 Ab, 4th Generation: NONREACTIVE

## 2014-07-20 LAB — RPR

## 2014-07-21 LAB — GC/CHLAMYDIA PROBE AMP
CT Probe RNA: POSITIVE — AB
GC Probe RNA: NEGATIVE

## 2014-07-22 ENCOUNTER — Telehealth (HOSPITAL_BASED_OUTPATIENT_CLINIC_OR_DEPARTMENT_OTHER): Payer: Self-pay | Admitting: Emergency Medicine

## 2014-07-22 NOTE — Telephone Encounter (Signed)
Post ED Visit - Positive Culture Follow-up: Successful Patient Follow-Up  Culture assessed and recommendations reviewed by:  Wes Dulaney, Pharm.D., BCPS  Celedonio Miyamoto, Pharm.D., BCPS  Georgina Pillion, Pharm.D., BCPS  [place], [place].D., BCPS, AAHIVP  Estella Husk, Pharm.D., BCPS, AAHIVP  Red Christians, Pharm.D.  Tennis Must, Pharm.D.  Positive chlamydia culture   Patient discharged without antimicrobial prescription and treatment is now indicated  Organism is resistant to prescribed ED discharge antimicrobial  Patient with positive blood cultures  Changes discussed with ED provider: sent to EDP 07/22/14 New antibiotic prescription  Called to   [facility] patient, date   , time    Berle Mull 07/22/2014, 9:11 AM

## 2014-07-24 ENCOUNTER — Telehealth (HOSPITAL_BASED_OUTPATIENT_CLINIC_OR_DEPARTMENT_OTHER): Payer: Self-pay | Admitting: Emergency Medicine

## 2014-09-15 ENCOUNTER — Other Ambulatory Visit: Payer: Self-pay | Admitting: Obstetrics & Gynecology

## 2014-09-16 LAB — CYTOLOGY - PAP

## 2014-09-28 ENCOUNTER — Encounter (HOSPITAL_COMMUNITY): Payer: Self-pay | Admitting: Emergency Medicine

## 2015-10-03 ENCOUNTER — Encounter (HOSPITAL_COMMUNITY): Payer: Self-pay | Admitting: *Deleted

## 2015-10-03 ENCOUNTER — Inpatient Hospital Stay (HOSPITAL_COMMUNITY)
Admission: AD | Admit: 2015-10-03 | Discharge: 2015-10-03 | Disposition: A | Discharge status: Home / Self Care | Payer: BLUE CROSS/BLUE SHIELD | Source: Ambulatory Visit | Attending: Obstetrics and Gynecology | Admitting: Obstetrics and Gynecology

## 2015-10-03 DIAGNOSIS — F439 Reaction to severe stress, unspecified: Secondary | ICD-10-CM | POA: Diagnosis present

## 2015-10-03 DIAGNOSIS — Z3201 Encounter for pregnancy test, result positive: Secondary | ICD-10-CM | POA: Insufficient documentation

## 2015-10-03 DIAGNOSIS — F419 Anxiety disorder, unspecified: Secondary | ICD-10-CM | POA: Insufficient documentation

## 2015-10-03 DIAGNOSIS — Z880 Allergy status to penicillin: Secondary | ICD-10-CM | POA: Insufficient documentation

## 2015-10-03 DIAGNOSIS — R4589 Other symptoms and signs involving emotional state: Secondary | ICD-10-CM

## 2015-10-03 NOTE — MAU Provider Note (Signed)
History     CSN: [id number]  Arrival date and time: 10/03/15 4098   First Provider Initiated Contact with Patient 10/03/15 0214      Chief Complaint  Patient presents with  . Stress   HPI Pt presents c/o seeing red fuzz on underwear and stress about wanting to know if baby is okay.  She states the fuzz may have come from her red shirt she is wearing.  She denies vaginal bleeding, abdominal pain, dysuria, vaginal discharge, suicidal/homicidal ideation.  She states no wetness or saturation whatsoever on her underwear and nothing noted with wiping after using bathroom.  She requests ultrasound.  She has appt on Thursday of this coming week for new OB appt.  No panic attack or physical symptoms of anxiety presently. OB History    Gravida Para Term Preterm AB TAB SAB Ectopic Multiple Living   Past Medical History  Diagnosis Date  . No pertinent past medical history   . Kidney stones     Past Surgical History  Procedure Laterality Date  . Mouth surgery      Family History  Problem Relation Age of Onset  . Cancer Mother   . Heart disease Mother   . Diabetes Mother     Social History  Substance Use Topics  . Smoking status: Never Smoker   . Smokeless tobacco: None  . Alcohol Use: No    Allergies:  Allergies  Allergen Reactions  . Augmentin [Amoxicillin-Pot Clavulanate] Nausea And Vomiting    Prescriptions prior to admission  Medication Sig Dispense Refill Last Dose  . Prenatal Vit-Fe Fumarate-FA (PRENATAL MULTIVITAMIN) TABS tablet Take 1 tablet by mouth daily at 12 noon.   10/02/2015 at Unknown time  . levonorgestrel (MIRENA) 20 MCG/24HR IUD 1 each by Intrauterine route once.   current  . metroNIDAZOLE (FLAGYL) 500 MG tablet Take 1 tablet (500 mg total) by mouth 2 (two) times daily. 14 tablet 0   . naproxen (NAPROSYN) 500 MG tablet Take 1 tablet (500 mg total) by mouth 2 (two) times daily with a meal. 30 tablet 0   . ondansetron (ZOFRAN) 4 MG  tablet Take 1 tablet (4 mg total) by mouth every 6 (six) hours. 12 tablet 0     ROS Pertinent ROS in HPI.  All other systems are negative.   Physical Exam   Blood pressure 128/73, pulse 104, temperature 98.1 F (36.7 C), resp. rate 18, height 5' (1.524 m), weight 137 lb 6.4 oz (62.324 kg), last menstrual period 08/13/2015.  Physical Exam  Constitutional: She is oriented to person, place, and time. She appears well-developed and well-nourished. No distress.  Cardiovascular: Normal rate.   Respiratory: Effort normal. No respiratory distress.  Musculoskeletal: Normal range of motion. She exhibits no edema.  Neurological: She is alert and oriented to person, place, and time.  Psychiatric: She has a normal mood and affect. Her behavior is normal.    MAU Course  Procedures  MDM No cause for concern at this time.  Pt not feeling panicky or anxious.  Vitals stable.  Pregnancy test positive.  Assessment and Plan  A:  1. Positive pregnancy test   2. Feeling anxious      P: Discharge to home Safe pregnancy med sheet provided [facility] later this week as scheduled  PNV qd Patient may return to MAU as needed or if her condition were to change or worsen  Duane BostonKaren E Teague Clark 10/03/2015, 2:15 AM

## 2015-10-03 NOTE — Progress Notes (Signed)
(  EST) Written and verbal d/c instructions given and understanding voiced.

## 2015-10-03 NOTE — MAU Note (Signed)
EST Nada MaclachlanKaren Teague Clark PA in to talk with pt

## 2015-10-03 NOTE — MAU Note (Signed)
Earlier today got stressed out or had panic attack. Went to [facility] earlier and saw sl red on panties but could not tell if was discharge or lint from red shirt i had on. Just wanted to be sure baby is ok. No pain

## 2015-10-04 LAB — POCT PREGNANCY, URINE: Preg Test, Ur: POSITIVE — AB

## 2015-10-07 ENCOUNTER — Other Ambulatory Visit: Payer: Self-pay | Admitting: Obstetrics and Gynecology

## 2015-10-08 LAB — CYTOLOGY - PAP

## 2015-11-03 LAB — OB RESULTS CONSOLE RUBELLA ANTIBODY, IGM: RUBELLA: IMMUNE

## 2015-11-03 LAB — OB RESULTS CONSOLE HIV ANTIBODY (ROUTINE TESTING): HIV: NONREACTIVE

## 2015-11-28 NOTE — L&D Delivery Note (Signed)
Patient was C/C/+3 and pushed for 5 minutes with epidural.   NSVD  female infant, Apgars 9,9, weight P.   The patient had no lacerations. Fundus was firm. EBL was expected amount. Placenta was delivered intact. Vagina was clear.  Baby was vigorous and doing skin to skin with mother.  Jill Vaughn A

## 2015-12-02 ENCOUNTER — Other Ambulatory Visit: Payer: Self-pay | Admitting: Obstetrics and Gynecology

## 2015-12-02 LAB — OB RESULTS CONSOLE GC/CHLAMYDIA
CHLAMYDIA, DNA PROBE: NEGATIVE
Gonorrhea: NEGATIVE

## 2016-02-23 ENCOUNTER — Inpatient Hospital Stay (HOSPITAL_COMMUNITY)
Admission: AD | Admit: 2016-02-23 | Discharge: 2016-02-23 | Disposition: A | Discharge status: Home / Self Care | Payer: BLUE CROSS/BLUE SHIELD | Source: Ambulatory Visit | Attending: Obstetrics and Gynecology | Admitting: Obstetrics and Gynecology

## 2016-02-23 ENCOUNTER — Encounter (HOSPITAL_COMMUNITY): Payer: Self-pay | Admitting: *Deleted

## 2016-02-23 DIAGNOSIS — Z881 Allergy status to other antibiotic agents status: Secondary | ICD-10-CM | POA: Diagnosis not present

## 2016-02-23 DIAGNOSIS — O99891 Other specified diseases and conditions complicating pregnancy: Secondary | ICD-10-CM

## 2016-02-23 DIAGNOSIS — Z79899 Other long term (current) drug therapy: Secondary | ICD-10-CM | POA: Insufficient documentation

## 2016-02-23 DIAGNOSIS — M549 Dorsalgia, unspecified: Secondary | ICD-10-CM

## 2016-02-23 DIAGNOSIS — O26893 Other specified pregnancy related conditions, third trimester: Secondary | ICD-10-CM | POA: Diagnosis not present

## 2016-02-23 DIAGNOSIS — O26892 Other specified pregnancy related conditions, second trimester: Secondary | ICD-10-CM | POA: Insufficient documentation

## 2016-02-23 DIAGNOSIS — M545 Low back pain: Secondary | ICD-10-CM | POA: Diagnosis present

## 2016-02-23 DIAGNOSIS — O2343 Unspecified infection of urinary tract in pregnancy, third trimester: Secondary | ICD-10-CM | POA: Diagnosis not present

## 2016-02-23 DIAGNOSIS — O2342 Unspecified infection of urinary tract in pregnancy, second trimester: Secondary | ICD-10-CM | POA: Diagnosis not present

## 2016-02-23 DIAGNOSIS — Z3A27 27 weeks gestation of pregnancy: Secondary | ICD-10-CM | POA: Diagnosis not present

## 2016-02-23 DIAGNOSIS — O9989 Other specified diseases and conditions complicating pregnancy, childbirth and the puerperium: Secondary | ICD-10-CM

## 2016-02-23 LAB — URINALYSIS, ROUTINE W REFLEX MICROSCOPIC
BILIRUBIN URINE: NEGATIVE
Glucose, UA: NEGATIVE mg/dL
Ketones, ur: NEGATIVE mg/dL
NITRITE: POSITIVE — AB
PROTEIN: NEGATIVE mg/dL
Specific Gravity, Urine: 1.02 (ref 1.005–1.030)
pH: 7.5 (ref 5.0–8.0)

## 2016-02-23 LAB — URINE MICROSCOPIC-ADD ON

## 2016-02-23 MED ORDER — ACETAMINOPHEN 500 MG PO TABS
1000.0000 mg | ORAL_TABLET | Freq: Once | ORAL | Status: AC
  Administered 2016-02-23: 1000 mg via ORAL
  Filled 2016-02-23: qty 2

## 2016-02-23 MED ORDER — CEPHALEXIN 500 MG PO CAPS
500.0000 mg | ORAL_CAPSULE | Freq: Once | ORAL | Status: AC
  Administered 2016-02-23: 500 mg via ORAL
  Filled 2016-02-23: qty 1

## 2016-02-23 MED ORDER — CEPHALEXIN 500 MG PO CAPS: 500.0000 mg | ORAL_CAPSULE | Freq: Four times a day (QID) | ORAL | Status: DC

## 2016-02-23 NOTE — MAU Provider Note (Signed)
History     CSN: [id number]  Arrival date and time: 02/23/16 4098   First Provider Initiated Contact with Patient 02/23/16 1924      Chief Complaint  Patient presents with  . Back Pain   HPI   Jill Vaughn is a 25 y.o. female G22P2002 @ [redacted]w[redacted]d presenting to MAU with back pain. The pain started on Sunday, the pain has been off and on since Sunday. The became became worse today and it feels like my stomach is in a ball. The pain is located in the middle- near the bottom of her lower back.  " I have had kidney stones in the past and wasn't sure if I passed a stone".   Currently she rates her pain 4/10; feels like an irritating cramps in my lower back.  + fetal movement Denies leaking of fluid or vaginal bleeding No intercourse in the last 24 hours.   OB History    Gravida Para Term Preterm AB TAB SAB Ectopic Multiple Living   Past Medical History  Diagnosis Date  . No pertinent past medical history   . Kidney stones     Past Surgical History  Procedure Laterality Date  . Mouth surgery      Family History  Problem Relation Age of Onset  . Cancer Mother   . Heart disease Mother   . Diabetes Mother     Social History  Substance Use Topics  . Smoking status: Never Smoker   . Smokeless tobacco: None  . Alcohol Use: No    Allergies:  Allergies  Allergen Reactions  . Augmentin [Amoxicillin-Pot Clavulanate] Nausea And Vomiting    Has patient had a PCN reaction causing immediate rash, facial/tongue/throat swelling, SOB or lightheadedness with hypotension: No Has patient had a PCN reaction causing severe rash involving mucus membranes or skin necrosis: No Has patient had a PCN reaction that required hospitalization No Has patient had a PCN reaction occurring within the last 10 years: No If all of the above answers are "NO", then may proceed with Cephalosporin use.     Prescriptions prior to admission  Medication Sig Dispense Refill Last Dose   . Prenatal Vit-Fe Fumarate-FA (PRENATAL MULTIVITAMIN) TABS tablet Take 1 tablet by mouth daily at 12 noon.   Past Week at Unknown time  . ranitidine (ZANTAC) 150 MG tablet Take 150 mg by mouth daily as needed for heartburn.   02/22/2016 at Unknown time  . metroNIDAZOLE (FLAGYL) 500 MG tablet Take 1 tablet (500 mg total) by mouth 2 (two) times daily. (Patient not taking: Reported on 02/23/2016) 14 tablet 0   . naproxen (NAPROSYN) 500 MG tablet Take 1 tablet (500 mg total) by mouth 2 (two) times daily with a meal. (Patient not taking: Reported on 02/23/2016) 30 tablet 0   . ondansetron (ZOFRAN) 4 MG tablet Take 1 tablet (4 mg total) by mouth every 6 (six) hours. (Patient not taking: Reported on 02/23/2016) 12 tablet 0     Results for orders placed or performed during the hospital encounter of 02/23/16 (from the past 48 hour(s))  Urinalysis, Routine w reflex microscopic (not at [facility])     Status: Abnormal   Collection Time: 02/23/16  6:40 PM  Result Value Ref Range   Color, Urine YELLOW YELLOW   APPearance CLOUDY (A) CLEAR   Specific Gravity, Urine 1.020 1.005 - 1.030   pH 7.5 5.0 - 8.0  Glucose, UA NEGATIVE NEGATIVE mg/dL   Hgb urine dipstick MODERATE (A) NEGATIVE   Bilirubin Urine NEGATIVE NEGATIVE   Ketones, ur NEGATIVE NEGATIVE mg/dL   Protein, ur NEGATIVE NEGATIVE mg/dL   Nitrite POSITIVE (A) NEGATIVE   Leukocytes, UA MODERATE (A) NEGATIVE  Urine microscopic-add on     Status: Abnormal   Collection Time: 02/23/16  6:40 PM  Result Value Ref Range   Squamous Epithelial / LPF 0-5 (A) NONE SEEN   WBC, UA 0-5 0 - 5 WBC/hpf   RBC / HPF 0-5 0 - 5 RBC/hpf   Bacteria, UA MANY (A) NONE SEEN    Review of Systems  Constitutional: Negative for fever and chills.  Gastrointestinal: Negative for nausea, vomiting and abdominal pain.  Genitourinary: Negative for dysuria, urgency, frequency and hematuria.  Musculoskeletal: Positive for back pain.   Physical Exam   Blood pressure 116/68, pulse  113, temperature 98.9 F (37.2 C), temperature source Oral, resp. rate 20, height 5' (1.524 m), weight 149 lb (67.586 kg), last menstrual period 08/13/2015.  Physical Exam  Constitutional: She is oriented to person, place, and time. She appears well-developed and well-nourished. No distress.  HENT:  Head: Normocephalic.  Cardiovascular: Normal rate.   Respiratory: Effort normal.  GI: Soft. There is no tenderness. There is CVA tenderness (Mild tenderness near the entire right side of costoverterbral area and lower back.). There is no rebound.  Musculoskeletal: Normal range of motion.  Neurological: She is alert and oriented to person, place, and time.  Skin: Skin is warm. She is not diaphoretic.  Psychiatric: Her behavior is normal.    Dilation: Closed Effacement (%): Thick Cervical Position: Middle Exam by:: Venia CarbonJennifer Hershey Knauer NP   Fetal Tracing: Baseline: 140 bpm  Variability: Moderate  Accelerations:  10x10 and 15x15 Decelerations: Quick variables.  Toco: 30 minute period of UI, PO hydration given and UI resolved. Patient denies pain in her abdomen.   MAU Course  Procedures  None  MDM  Urine culture pending  PO hydration encouraged Tylenol 1 gram PO Discussed patient with Dr. Claiborne Billingsallahan> discussed plan to treat patient outpatient for UTI. Patient to follow up in the office as needed/ scheduled  First dose of keflex given to Patient here in MAU; 500 mg PO, patient tolerated it well  Assessment and Plan   A:  1. UTI in pregnancy, antepartum, third trimester   2. Back pain affecting pregnancy in third trimester     P:  Discharge home in stable condition Return to MAU if symptoms worsens; if patient develops N/V.  Follow up with OB as scheduled Preterm labor precautions RX: Keflex Ok to use tylenol over the counter as directed on the bottle.   Duane LopeJennifer I Shakthi Scipio, NP 02/24/2016 8:21 AM

## 2016-02-23 NOTE — Discharge Instructions (Signed)
Pregnancy and Urinary Tract Infection  A urinary tract infection (UTI) is a bacterial infection of the urinary tract. Infection of the urinary tract can include the ureters, kidneys (pyelonephritis), bladder (cystitis), and urethra (urethritis). All pregnant women should be screened for bacteria in the urinary tract. Identifying and treating a UTI will decrease the risk of preterm labor and developing more serious infections in both the mother and baby.  CAUSES  Bacteria germs cause almost all UTIs.   RISK FACTORS  Many factors can increase your chances of getting a UTI during pregnancy. These include:  · Having a short urethra.  · Poor toilet and hygiene habits.  · Sexual intercourse.  · Blockage of urine along the urinary tract.  · Problems with the pelvic muscles or nerves.  · Diabetes.  · Obesity.  · Bladder problems after having several children.  · Previous history of UTI.  SIGNS AND SYMPTOMS   · Pain, burning, or a stinging feeling when urinating.  · Suddenly feeling the need to urinate right away (urgency).  · Loss of bladder control (urinary incontinence).  · Frequent urination, more than is common with pregnancy.  · Lower abdominal or back discomfort.  · Cloudy urine.  · Blood in the urine (hematuria).  · Fever.   When the kidneys are infected, the symptoms may be:  · Back pain.  · Flank pain on the right side more so than the left.  · Fever.  · Chills.  · Nausea.  · Vomiting.  DIAGNOSIS   A urinary tract infection is usually diagnosed through urine tests. Additional tests and procedures are sometimes done. These may include:  · Ultrasound exam of the kidneys, ureters, bladder, and urethra.  · Looking in the bladder with a lighted tube (cystoscopy).  TREATMENT  Typically, UTIs can be treated with antibiotic medicines.   HOME CARE INSTRUCTIONS   · Only take over-the-counter or prescription medicines as directed by your health care provider. If you were prescribed antibiotics, take them as directed. Finish  them even if you start to feel better.  · Drink enough fluids to keep your urine clear or pale yellow.  · Do not have sexual intercourse until the infection is gone and your health care provider says it is okay.  · Make sure you are tested for UTIs throughout your pregnancy. These infections often come back.   Preventing a UTI in the Future  · Practice good toilet habits. Always wipe from front to back. Use the tissue only once.  · Do not hold your urine. Empty your bladder as soon as possible when the urge comes.  · Do not douche or use deodorant sprays.  · Wash with soap and warm water around the genital area and the anus.  · Empty your bladder before and after sexual intercourse.  · Wear underwear with a cotton crotch.  · Avoid caffeine and carbonated drinks. They can irritate the bladder.  · Drink cranberry juice or take cranberry pills. This may decrease the risk of getting a UTI.  · Do not drink alcohol.  · Keep all your appointments and tests as scheduled.   SEEK MEDICAL CARE IF:   · Your symptoms get worse.  · You are still having fevers 2 or more days after treatment begins.  · You have a rash.  · You feel that you are having problems with medicines prescribed.  · You have abnormal vaginal discharge.  SEEK IMMEDIATE MEDICAL CARE IF:   · You have back or flank   pain.  · You have chills.  · You have blood in your urine.  · You have nausea and vomiting.  · You have contractions of your uterus.  · You have a gush of fluid from the vagina.  MAKE SURE YOU:  · Understand these instructions.    · Will watch your condition.    · Will get help right away if you are not doing well or get worse.       This information is not intended to replace advice given to you by your health care provider. Make sure you discuss any questions you have with your health care provider.     Document Released: 03/10/2011 Document Revised: 09/03/2013 Document Reviewed: 06/12/2013  Elsevier Interactive Patient Education ©2016 [employer]  Inc[employer]

## 2016-02-23 NOTE — MAU Note (Signed)
Pt. States that she started having back pain on Sunday and has been on and off since then.  Today pt. Started feeling like her stomach was tightening while standing and walking around.  Pt. Also has a headache.  Denies bleeding or lof.

## 2016-02-24 LAB — CULTURE, OB URINE
Culture: 3000
Special Requests: NORMAL

## 2016-02-25 LAB — OB RESULTS CONSOLE RPR: RPR: NONREACTIVE

## 2016-04-02 ENCOUNTER — Encounter (HOSPITAL_COMMUNITY): Payer: Self-pay | Admitting: *Deleted

## 2016-04-02 ENCOUNTER — Inpatient Hospital Stay (HOSPITAL_COMMUNITY)
Admission: AD | Admit: 2016-04-02 | Discharge: 2016-04-02 | Disposition: A | Discharge status: Home / Self Care | Payer: BLUE CROSS/BLUE SHIELD | Source: Ambulatory Visit | Attending: Obstetrics and Gynecology | Admitting: Obstetrics and Gynecology

## 2016-04-02 DIAGNOSIS — Z8249 Family history of ischemic heart disease and other diseases of the circulatory system: Secondary | ICD-10-CM | POA: Insufficient documentation

## 2016-04-02 DIAGNOSIS — Z87442 Personal history of urinary calculi: Secondary | ICD-10-CM | POA: Insufficient documentation

## 2016-04-02 DIAGNOSIS — Z833 Family history of diabetes mellitus: Secondary | ICD-10-CM | POA: Diagnosis not present

## 2016-04-02 DIAGNOSIS — Z809 Family history of malignant neoplasm, unspecified: Secondary | ICD-10-CM | POA: Insufficient documentation

## 2016-04-02 DIAGNOSIS — O4703 False labor before 37 completed weeks of gestation, third trimester: Secondary | ICD-10-CM | POA: Diagnosis present

## 2016-04-02 DIAGNOSIS — Z3A33 33 weeks gestation of pregnancy: Secondary | ICD-10-CM | POA: Insufficient documentation

## 2016-04-02 DIAGNOSIS — O368190 Decreased fetal movements, unspecified trimester, not applicable or unspecified: Secondary | ICD-10-CM | POA: Diagnosis not present

## 2016-04-02 DIAGNOSIS — Z880 Allergy status to penicillin: Secondary | ICD-10-CM | POA: Diagnosis not present

## 2016-04-02 DIAGNOSIS — O4700 False labor before 37 completed weeks of gestation, unspecified trimester: Secondary | ICD-10-CM | POA: Diagnosis not present

## 2016-04-02 LAB — URINALYSIS, ROUTINE W REFLEX MICROSCOPIC
Bilirubin Urine: NEGATIVE
Glucose, UA: NEGATIVE mg/dL
Hgb urine dipstick: NEGATIVE
KETONES UR: NEGATIVE mg/dL
Nitrite: NEGATIVE
PH: 7 (ref 5.0–8.0)
Protein, ur: NEGATIVE mg/dL
SPECIFIC GRAVITY, URINE: 1.01 (ref 1.005–1.030)

## 2016-04-02 LAB — URINE MICROSCOPIC-ADD ON

## 2016-04-02 MED ORDER — LACTATED RINGERS IV BOLUS (SEPSIS)
1000.0000 mL | Freq: Once | INTRAVENOUS | Status: AC
  Administered 2016-04-02: 1000 mL via INTRAVENOUS

## 2016-04-02 NOTE — MAU Provider Note (Signed)
History     CSN: [id number]  Arrival date and time: 04/02/16 [id number]   First Provider Initiated Contact with Patient 04/02/16 1929      Chief Complaint  Patient presents with  . Contractions   HPI Jill Vaughn 25 y.o. [id number] @ 6043w2d presents to the MAU stating that she has had contractions for the past 3 days. She endorses that she had intercourse in the past 24 hours. She denies vaginal bleeding, LOF. She reports decreased fetal movement over the past 3 days.   Past Medical History  Diagnosis Date  . No pertinent past medical history   . Kidney stones     Past Surgical History  Procedure Laterality Date  . Mouth surgery      Family History  Problem Relation Age of Onset  . Cancer Mother   . Heart disease Mother   . Diabetes Mother     Social History  Substance Use Topics  . Smoking status: Never Smoker   . Smokeless tobacco: None  . Alcohol Use: No    Allergies:  Allergies  Allergen Reactions  . Augmentin [Amoxicillin-Pot Clavulanate] Nausea And Vomiting    Has patient had a PCN reaction causing immediate rash, facial/tongue/throat swelling, SOB or lightheadedness with hypotension: No Has patient had a PCN reaction causing severe rash involving mucus membranes or skin necrosis: No Has patient had a PCN reaction that required hospitalization No Has patient had a PCN reaction occurring within the last 10 years: No If all of the above answers are "NO", then may proceed with Cephalosporin use.     Prescriptions prior to admission  Medication Sig Dispense Refill Last Dose  . Prenatal Vit-Fe Fumarate-FA (PRENATAL MULTIVITAMIN) TABS tablet Take 1 tablet by mouth daily.    04/02/2016 at Unknown time  . ranitidine (ZANTAC) 150 MG tablet Take 150 mg by mouth 2 (two) times daily as needed for heartburn.    Past Week at Unknown time  . cephALEXin (KEFLEX) 500 MG capsule Take 1 capsule (500 mg total) by mouth 4 (four) times daily. (Patient not taking: Reported on 04/02/2016)  20 capsule 0     Review of Systems  Constitutional: Negative for fever.  Gastrointestinal: Positive for abdominal pain.  Genitourinary:       Decreased fetal movement  All other systems reviewed and are negative.  Physical Exam   Blood pressure 102/62, pulse 122, temperature 98.1 F (36.7 C), resp. rate 18, last menstrual period 08/13/2015.  Physical Exam  Nursing note and vitals reviewed. Constitutional: She is oriented to person, place, and time. She appears well-developed and well-nourished. No distress.  HENT:  Head: Normocephalic and atraumatic.  Cardiovascular: Normal rate.   Respiratory: Effort normal and breath sounds normal. No respiratory distress.  GI: Soft. She exhibits no mass. There is no tenderness. There is no rebound and no guarding.  Genitourinary: Vagina normal.  Musculoskeletal: Normal range of motion.  Neurological: She is alert and oriented to person, place, and time.  Skin: Skin is warm and dry.  Psychiatric: She has a normal mood and affect. Her behavior is normal. Judgment and thought content normal.   Dilation: Closed Exam by:: Illene BolusLori Clemmons, CNM Results for orders placed or performed during the hospital encounter of 04/02/16 (from the past 24 hour(s))  Urinalysis, [facility]ine w reflex microscopic [facility])     Status: Abnormal   Collection Time: 04/02/16  6:50 PM  Result Value Ref Range   Color, Urine YELLOW YELLOW   APPearance CLEAR  CLEAR   Specific Gravity, Urine 1.010 1.005 - 1.030   pH 7.0 5.0 - 8.0   Glucose, UA NEGATIVE NEGATIVE mg/dL   Hgb urine dipstick NEGATIVE NEGATIVE   Bilirubin Urine NEGATIVE NEGATIVE   Ketones, ur NEGATIVE NEGATIVE mg/dL   Protein, ur NEGATIVE NEGATIVE mg/dL   Nitrite NEGATIVE NEGATIVE   Leukocytes, UA TRACE (A) NEGATIVE  Urine microscopic-add on     Status: Abnormal   Collection Time: 04/02/16  6:50 PM  Result Value Ref Range   Squamous Epithelial / LPF 0-5 (A) NONE SEEN   WBC, UA 0-5 0 - 5 WBC/hpf   RBC /  HPF 0-5 0 - 5 RBC/hpf   Bacteria, UA RARE (A) NONE SEEN   Urine-Other AMORPHOUS URATES/PHOSPHATES    04/02/16 2102  --  102  --  18  109/72 mmHg  Semi-fowlers  --  --  -- EM     04/02/16 1853  98.1 F (36.7 C)   122  --  18  102/62 mmHg  --  --  --  -- GM          MAU Course  Procedures  MDM IVF X 1 liter given for preterm contractions. No cervical change noted . Pt states that her contractions have spaced out and pain is better. FHR Category 1. Preterm labor precautions will be given. Dr Claiborne Billings aware of pt status. She will be discharge home and is agreeable to POC.  Assessment and Plan  Preterm contractions  Preterm labor precautions  Discharge to home  [facility] [place] 04/02/2016, 8:44 PM

## 2016-04-02 NOTE — Discharge Instructions (Signed)

## 2016-04-02 NOTE — MAU Note (Signed)
Pt presents to MAU with complaints of contractions that started around 4 today. Denies any vaginal bleeding or abnormal discharge

## 2016-04-02 NOTE — MAU Note (Signed)
Pt. States she has been contracting for 3 days off and on. States contractions were more intense yesterday. Pt. States she hasn't been counting her contractions. Denies LOF or bleeding. Baby is moving ok. Pt. States movement has decreased since yesterday. Next appointment with Dr. Dareen PianoAnderson is May 18. Here for evaluation.

## 2016-04-20 ENCOUNTER — Other Ambulatory Visit: Payer: Self-pay | Admitting: Obstetrics and Gynecology

## 2016-04-28 ENCOUNTER — Inpatient Hospital Stay (HOSPITAL_COMMUNITY)
Admission: AD | Admit: 2016-04-28 | Discharge: 2016-04-30 | DRG: 775 | Disposition: A | Discharge status: Home / Self Care | Payer: BLUE CROSS/BLUE SHIELD | Source: Ambulatory Visit | Attending: Obstetrics and Gynecology | Admitting: Obstetrics and Gynecology

## 2016-04-28 ENCOUNTER — Encounter (HOSPITAL_COMMUNITY): Payer: Self-pay

## 2016-04-28 ENCOUNTER — Inpatient Hospital Stay (HOSPITAL_COMMUNITY): Payer: BLUE CROSS/BLUE SHIELD | Admitting: Anesthesiology

## 2016-04-28 DIAGNOSIS — Z87442 Personal history of urinary calculi: Secondary | ICD-10-CM | POA: Diagnosis not present

## 2016-04-28 DIAGNOSIS — O134 Gestational [pregnancy-induced] hypertension without significant proteinuria, complicating childbirth: Secondary | ICD-10-CM | POA: Diagnosis present

## 2016-04-28 DIAGNOSIS — Z3A37 37 weeks gestation of pregnancy: Secondary | ICD-10-CM

## 2016-04-28 DIAGNOSIS — Z3490 Encounter for supervision of normal pregnancy, unspecified, unspecified trimester: Secondary | ICD-10-CM

## 2016-04-28 LAB — PROTEIN / CREATININE RATIO, URINE
CREATININE, URINE: 55 mg/dL
Protein Creatinine Ratio: 0.2 mg/mg{Cre} — ABNORMAL HIGH (ref 0.00–0.15)
TOTAL PROTEIN, URINE: 11 mg/dL

## 2016-04-28 LAB — CBC
HCT: 33.2 % — ABNORMAL LOW (ref 36.0–46.0)
Hemoglobin: 11 g/dL — ABNORMAL LOW (ref 12.0–15.0)
MCH: 27.9 pg (ref 26.0–34.0)
MCHC: 33.1 g/dL (ref 30.0–36.0)
MCV: 84.3 fL (ref 78.0–100.0)
PLATELETS: 283 10*3/uL (ref 150–400)
RBC: 3.94 MIL/uL (ref 3.87–5.11)
RDW: 13.8 % (ref 11.5–15.5)
WBC: 9.3 10*3/uL (ref 4.0–10.5)

## 2016-04-28 LAB — COMPREHENSIVE METABOLIC PANEL
ALBUMIN: 2.5 g/dL — AB (ref 3.5–5.0)
ALT: 10 U/L — ABNORMAL LOW (ref 14–54)
ANION GAP: 9 (ref 5–15)
AST: 18 U/L (ref 15–41)
Alkaline Phosphatase: 119 U/L (ref 38–126)
BUN: 7 mg/dL (ref 6–20)
CHLORIDE: 106 mmol/L (ref 101–111)
CO2: 17 mmol/L — AB (ref 22–32)
Calcium: 8.6 mg/dL — ABNORMAL LOW (ref 8.9–10.3)
Creatinine, Ser: 0.51 mg/dL (ref 0.44–1.00)
GFR calc Af Amer: >60 mL/min (ref >60)
GFR calc non Af Amer: >60 mL/min (ref >60)
GLUCOSE: 85 mg/dL (ref 65–99)
POTASSIUM: 4.2 mmol/L (ref 3.5–5.1)
SODIUM: 132 mmol/L — AB (ref 135–145)
Total Bilirubin: <0.1 mg/dL — ABNORMAL LOW (ref 0.3–1.2)
Total Protein: 5.8 g/dL — ABNORMAL LOW (ref 6.5–8.1)

## 2016-04-28 LAB — URIC ACID: Uric Acid, Serum: 4.3 mg/dL (ref 2.3–6.6)

## 2016-04-28 MED ORDER — ONDANSETRON HCL 4 MG/2ML IJ SOLN: 4.0000 mg | INTRAMUSCULAR | Status: DC | PRN

## 2016-04-28 MED ORDER — OXYCODONE-ACETAMINOPHEN 5-325 MG PO TABS: 1.0000 | ORAL_TABLET | ORAL | Status: DC | PRN

## 2016-04-28 MED ORDER — SODIUM CHLORIDE 0.9 % IV SOLN: 250.0000 mL | INTRAVENOUS | Status: DC | PRN

## 2016-04-28 MED ORDER — ZOLPIDEM TARTRATE 5 MG PO TABS: 5.0000 mg | ORAL_TABLET | Freq: Every evening | ORAL | Status: DC | PRN

## 2016-04-28 MED ORDER — EPHEDRINE 5 MG/ML INJ
10.0000 mg | INTRAVENOUS | Status: DC | PRN
  Filled 2016-04-28: qty 2

## 2016-04-28 MED ORDER — OXYTOCIN BOLUS FROM INFUSION: 500.0000 mL | INTRAVENOUS | Status: DC

## 2016-04-28 MED ORDER — PHENYLEPHRINE 40 MCG/ML (10ML) SYRINGE FOR IV PUSH (FOR BLOOD PRESSURE SUPPORT)
80.0000 ug | PREFILLED_SYRINGE | INTRAVENOUS | Status: DC | PRN
  Filled 2016-04-28: qty 5

## 2016-04-28 MED ORDER — LACTATED RINGERS IV SOLN
INTRAVENOUS | Status: DC
  Administered 2016-04-28: 10:00:00 via INTRAVENOUS

## 2016-04-28 MED ORDER — FENTANYL 2.5 MCG/ML BUPIVACAINE 1/10 % EPIDURAL INFUSION (WH - ANES)
14.0000 mL/h | INTRAMUSCULAR | Status: DC | PRN
  Administered 2016-04-28: 14 mL/h via EPIDURAL
  Filled 2016-04-28: qty 125

## 2016-04-28 MED ORDER — LIDOCAINE HCL (PF) 1 % IJ SOLN
INTRAMUSCULAR | Status: DC | PRN
  Administered 2016-04-28 (×2): 5 mL

## 2016-04-28 MED ORDER — ACETAMINOPHEN 325 MG PO TABS: 650.0000 mg | ORAL_TABLET | ORAL | Status: DC | PRN

## 2016-04-28 MED ORDER — TERBUTALINE SULFATE 1 MG/ML IJ SOLN
0.2500 mg | Freq: Once | INTRAMUSCULAR | Status: DC | PRN
  Filled 2016-04-28: qty 1

## 2016-04-28 MED ORDER — SOD CITRATE-CITRIC ACID 500-334 MG/5ML PO SOLN
30.0000 mL | ORAL | Status: DC | PRN
Start: 2016-04-28 — End: 2016-04-28

## 2016-04-28 MED ORDER — COCONUT OIL OIL: 1.0000 "application " | TOPICAL_OIL | Status: DC | PRN

## 2016-04-28 MED ORDER — OXYTOCIN 40 UNITS IN LACTATED RINGERS INFUSION - SIMPLE MED: 2.5000 [IU]/h | INTRAVENOUS | Status: DC

## 2016-04-28 MED ORDER — TETANUS-DIPHTH-ACELL PERTUSSIS 5-2.5-18.5 LF-MCG/0.5 IM SUSP: 0.5000 mL | Freq: Once | INTRAMUSCULAR | Status: DC

## 2016-04-28 MED ORDER — FAMOTIDINE 20 MG PO TABS
10.0000 mg | ORAL_TABLET | Freq: Two times a day (BID) | ORAL | Status: DC
  Administered 2016-04-28 – 2016-04-29 (×2): 10 mg via ORAL
  Filled 2016-04-28 (×3): qty 1

## 2016-04-28 MED ORDER — IBUPROFEN 800 MG PO TABS
800.0000 mg | ORAL_TABLET | Freq: Three times a day (TID) | ORAL | Status: DC
  Administered 2016-04-28 – 2016-04-30 (×5): 800 mg via ORAL
  Filled 2016-04-28 (×5): qty 1

## 2016-04-28 MED ORDER — LACTATED RINGERS IV SOLN: 500.0000 mL | Freq: Once | INTRAVENOUS | Status: DC

## 2016-04-28 MED ORDER — OXYCODONE-ACETAMINOPHEN 5-325 MG PO TABS
2.0000 | ORAL_TABLET | ORAL | Status: DC | PRN
  Administered 2016-04-29: 2 via ORAL
  Filled 2016-04-28: qty 2

## 2016-04-28 MED ORDER — METHYLERGONOVINE MALEATE 0.2 MG PO TABS: 0.2000 mg | ORAL_TABLET | ORAL | Status: DC | PRN

## 2016-04-28 MED ORDER — DIPHENHYDRAMINE HCL 25 MG PO CAPS: 25.0000 mg | ORAL_CAPSULE | Freq: Four times a day (QID) | ORAL | Status: DC | PRN

## 2016-04-28 MED ORDER — FERROUS SULFATE 325 (65 FE) MG PO TABS
325.0000 mg | ORAL_TABLET | Freq: Two times a day (BID) | ORAL | Status: DC
  Administered 2016-04-29 – 2016-04-30 (×2): 325 mg via ORAL
  Filled 2016-04-28 (×3): qty 1

## 2016-04-28 MED ORDER — PHENYLEPHRINE 40 MCG/ML (10ML) SYRINGE FOR IV PUSH (FOR BLOOD PRESSURE SUPPORT)
80.0000 ug | PREFILLED_SYRINGE | INTRAVENOUS | Status: DC | PRN
  Filled 2016-04-28: qty 10
  Filled 2016-04-28: qty 5

## 2016-04-28 MED ORDER — OXYTOCIN 40 UNITS IN LACTATED RINGERS INFUSION - SIMPLE MED
1.0000 m[IU]/min | INTRAVENOUS | Status: DC
  Administered 2016-04-28: 2 m[IU]/min via INTRAVENOUS
  Filled 2016-04-28: qty 1000

## 2016-04-28 MED ORDER — METHYLERGONOVINE MALEATE 0.2 MG/ML IJ SOLN: 0.2000 mg | INTRAMUSCULAR | Status: DC | PRN

## 2016-04-28 MED ORDER — LIDOCAINE HCL (PF) 1 % IJ SOLN
30.0000 mL | INTRAMUSCULAR | Status: DC | PRN
  Filled 2016-04-28: qty 30

## 2016-04-28 MED ORDER — DIBUCAINE 1 % RE OINT: 1.0000 "application " | TOPICAL_OINTMENT | RECTAL | Status: DC | PRN

## 2016-04-28 MED ORDER — LACTATED RINGERS IV SOLN: 500.0000 mL | INTRAVENOUS | Status: DC | PRN

## 2016-04-28 MED ORDER — OXYCODONE-ACETAMINOPHEN 5-325 MG PO TABS
2.0000 | ORAL_TABLET | ORAL | Status: DC | PRN
Start: 2016-04-28 — End: 2016-04-28

## 2016-04-28 MED ORDER — DIPHENHYDRAMINE HCL 50 MG/ML IJ SOLN: 12.5000 mg | INTRAMUSCULAR | Status: DC | PRN

## 2016-04-28 MED ORDER — SODIUM CHLORIDE 0.9% FLUSH: 3.0000 mL | Freq: Two times a day (BID) | INTRAVENOUS | Status: DC

## 2016-04-28 MED ORDER — SODIUM CHLORIDE 0.9% FLUSH: 3.0000 mL | INTRAVENOUS | Status: DC | PRN

## 2016-04-28 MED ORDER — ONDANSETRON HCL 4 MG PO TABS: 4.0000 mg | ORAL_TABLET | ORAL | Status: DC | PRN

## 2016-04-28 MED ORDER — SIMETHICONE 80 MG PO CHEW: 80.0000 mg | CHEWABLE_TABLET | ORAL | Status: DC | PRN

## 2016-04-28 MED ORDER — BENZOCAINE-MENTHOL 20-0.5 % EX AERO: 1.0000 "application " | INHALATION_SPRAY | CUTANEOUS | Status: DC | PRN

## 2016-04-28 MED ORDER — ONDANSETRON HCL 4 MG/2ML IJ SOLN: 4.0000 mg | Freq: Four times a day (QID) | INTRAMUSCULAR | Status: DC | PRN

## 2016-04-28 MED ORDER — FLEET ENEMA 7-19 GM/118ML RE ENEM: 1.0000 | ENEMA | RECTAL | Status: DC | PRN

## 2016-04-28 MED ORDER — MAGNESIUM HYDROXIDE 400 MG/5ML PO SUSP: 30.0000 mL | ORAL | Status: DC | PRN

## 2016-04-28 MED ORDER — WITCH HAZEL-GLYCERIN EX PADS
1.0000 "application " | MEDICATED_PAD | CUTANEOUS | Status: DC | PRN
  Administered 2016-04-29: 1 via TOPICAL

## 2016-04-28 MED ORDER — MEASLES, MUMPS & RUBELLA VAC ~~LOC~~ INJ
0.5000 mL | INJECTION | Freq: Once | SUBCUTANEOUS | Status: DC
  Filled 2016-04-28: qty 0.5

## 2016-04-28 MED ORDER — SENNOSIDES-DOCUSATE SODIUM 8.6-50 MG PO TABS
2.0000 | ORAL_TABLET | ORAL | Status: DC
  Administered 2016-04-28 – 2016-04-29 (×2): 2 via ORAL
  Filled 2016-04-28 (×2): qty 2

## 2016-04-28 MED ORDER — PRENATAL MULTIVITAMIN CH
1.0000 | ORAL_TABLET | Freq: Every day | ORAL | Status: DC
  Administered 2016-04-29: 1 via ORAL
  Filled 2016-04-28 (×2): qty 1

## 2016-04-28 NOTE — H&P (Signed)
25 y.o. 8347w0d  G3P2002 comes in for induction at term for PIH.  Otherwise has good fetal movement and no bleeding.  Past Medical History  Diagnosis Date  . No pertinent past medical history   . Kidney stones     Past Surgical History  Procedure Laterality Date  . Mouth surgery      OB History  Gravida Para Term Preterm AB SAB TAB Ectopic Multiple Living  3 2 2       2     # Outcome Date GA Lbr Len/2nd Weight Sex Delivery Anes PTL Lv  3 Current           2 Term      Vag-Spont     1 Term      Vag-Spont EPI        Social History   Social History  . Marital Status: Legally Separated    Spouse Name: N/A  . Number of Children: N/A  . Years of Education: N/A   Occupational History  . Not on file.   Social History Main Topics  . Smoking status: Never Smoker   . Smokeless tobacco: Not on file  . Alcohol Use: No  . Drug Use: No  . Sexual Activity: Yes    Birth Control/ Protection: None   Other Topics Concern  . Not on file   Social History Narrative   Augmentin    Prenatal Transfer Tool  Maternal Diabetes: No- passed 3 hour Genetic Screening: Normal Maternal Ultrasounds/Referrals: Normal Fetal Ultrasounds or other Referrals:  None Maternal Substance Abuse:  No Significant Maternal Medications:  None Significant Maternal Lab Results: Lab values include: Other: CT pos in early pregnancy, negative at 36 weeks.  Other WJX:BJYNWPNC:Began with elevated BPs 140s/90s at 35 weeks.   There were no vitals filed for this visit.   Lungs/Cor:  NAD Abdomen:  soft, gravid Ex:  no cords, erythema SVE:  2/70/-2 in office FHTs:  130s, good STV, NST R Toco:  q 3-5   A/P   Term induction for PIH.  GBS neg.  Jill Vaughn A

## 2016-04-28 NOTE — Anesthesia Procedure Notes (Signed)
Epidural Patient location during procedure: OB  Staffing Anesthesiologist: Tamelia Michalowski Performed by: anesthesiologist   Preanesthetic Checklist Completed: patient identified, site marked, surgical consent, pre-op evaluation, timeout performed, IV checked, risks and benefits discussed and monitors and equipment checked  Epidural Patient position: sitting Prep: DuraPrep Patient monitoring: heart rate, continuous pulse ox and blood pressure Approach: midline Location: L3-L4 Injection technique: LOR saline  Needle:  Needle type: Tuohy  Needle gauge: 17 G Needle length: 9 cm and 9 Needle insertion depth: 6 cm Catheter type: closed end flexible Catheter size: 20 Guage Catheter at skin depth: 10 cm Test dose: negative  Assessment Events: blood not aspirated, injection not painful, no injection resistance, negative IV test and no paresthesia  Additional Notes Patient identified. Risks/Benefits/Options discussed with patient including but not limited to bleeding, infection, nerve damage, paralysis, failed block, incomplete pain control, headache, blood pressure changes, nausea, vomiting, reactions to medication both or allergic, itching and postpartum back pain. Confirmed with bedside nurse the patient's most recent platelet count. Confirmed with patient that they are not currently taking any anticoagulation, have any bleeding history or any family history of bleeding disorders. Patient expressed understanding and wished to proceed. All questions were answered. Sterile technique was used throughout the entire procedure. Please see nursing notes for vital signs. Test dose was given through epidural needle and negative prior to continuing to dose epidural or start infusion. Warning signs of high block given to the patient including shortness of breath, tingling/numbness in hands, complete motor block, or any concerning symptoms with instructions to call for help. Patient was given  instructions on fall risk and not to get out of bed. All questions and concerns addressed with instructions to call with any issues.   

## 2016-04-28 NOTE — Anesthesia Preprocedure Evaluation (Signed)

## 2016-04-29 ENCOUNTER — Encounter (HOSPITAL_COMMUNITY): Payer: Self-pay | Admitting: Anesthesiology

## 2016-04-29 LAB — CBC
HEMATOCRIT: 31.7 % — AB (ref 36.0–46.0)
HEMOGLOBIN: 10.4 g/dL — AB (ref 12.0–15.0)
MCH: 27.9 pg (ref 26.0–34.0)
MCHC: 32.8 g/dL (ref 30.0–36.0)
MCV: 85 fL (ref 78.0–100.0)
Platelets: 260 10*3/uL (ref 150–400)
RBC: 3.73 MIL/uL — AB (ref 3.87–5.11)
RDW: 14.1 % (ref 11.5–15.5)
WBC: 13 10*3/uL — ABNORMAL HIGH (ref 4.0–10.5)

## 2016-04-29 LAB — RPR: RPR: NONREACTIVE

## 2016-04-29 MED ORDER — RHO D IMMUNE GLOBULIN 1500 UNIT/2ML IJ SOSY
300.0000 ug | PREFILLED_SYRINGE | Freq: Once | INTRAMUSCULAR | Status: AC
  Administered 2016-04-29: 300 ug via INTRAVENOUS
  Filled 2016-04-29: qty 2

## 2016-04-29 NOTE — Discharge Summary (Signed)
Obstetric Discharge Summary Reason for Admission: induction of labor Prenatal Procedures: PIH Intrapartum Procedures: spontaneous vaginal delivery Postpartum Procedures: Rho(D) Ig Complications-Operative and Postpartum: none HEMOGLOBIN  Date Value Ref Range Status  04/28/2016 11.0* 12.0 - 15.0 g/dL Final   HCT  Date Value Ref Range Status  04/28/2016 33.2* 36.0 - 46.0 % Final    Discharge Diagnoses: Term Pregnancy-delivered and pih  Discharge Information: Date: 04/29/2016 Activity: pelvic rest Diet: routine Medications: Ibuprofen and Iron Condition: stable Instructions: refer to practice specific booklet Discharge to: home Follow-up Information    Follow up with Alfretta Pinch A, MD In 4 weeks.   Specialty:  Obstetrics and Gynecology   Contact information:   [address] [place] [address] [phone]       Newborn Data: Live born female  Birth Weight: 7 lb (3175 g) APGAR: 9, 9  Home with mother.  Merinda Victorino A 04/29/2016, 4:40 AM

## 2016-04-29 NOTE — Progress Notes (Addendum)
Patient is eating, ambulating, voiding.  Pain control is good.  Filed Vitals:   04/28/16 2145 04/28/16 2245 04/29/16 0300 04/29/16 0523  BP: 118/68 124/71 121/62 113/64  Pulse: 87 91 88 95  Temp: 98.6 F (37 C) 98.5 F (36.9 C) 98.2 F (36.8 C) 98.2 F (36.8 C)  TempSrc: Oral Oral Oral Oral  Resp: 18 18 18 20   Height:      Weight:      SpO2:   98%     Fundus firm Perineum without swelling.  Lab Results  Component Value Date   WBC 13.0* 04/29/2016   HGB 10.4* 04/29/2016   HCT 31.7* 04/29/2016   MCV 85.0 04/29/2016   PLT 260 04/29/2016   CBC    Component Value Date/Time   WBC 13.0* 04/29/2016 0524   RBC 3.73* 04/29/2016 0524   HGB 10.4* 04/29/2016 0524   HCT 31.7* 04/29/2016 0524   PLT 260 04/29/2016 0524   MCV 85.0 04/29/2016 0524   MCH 27.9 04/29/2016 0524   MCHC 32.8 04/29/2016 0524   RDW 14.1 04/29/2016 0524   LYMPHSABS 1.1 07/19/2014 1653   MONOABS 0.5 07/19/2014 1653   EOSABS 0.0 07/19/2014 1653   BASOSABS 0.0 07/19/2014 1653      --/--/A NEG (06/02 0946)/RI  A/P Post partum day 1.  Routine care.  Expect d/c routine.  Baby is RH POS- pt to get Rhogam.   Parents desires circumsision.  All risks, benefits and alternatives discussed with the mother.  Michal Strzelecki A

## 2016-04-29 NOTE — Anesthesia Postprocedure Evaluation (Signed)
Anesthesia Post Note  Patient: [name]  Procedure(s) Performed: * No procedures listed *  Patient location during evaluation: Mother Baby Anesthesia Type: Epidural Level of consciousness: awake Pain management: pain level controlled Vital Signs Assessment: post-procedure vital signs reviewed and stable Respiratory status: spontaneous breathing Cardiovascular status: stable Postop Assessment: no headache, no backache, epidural receding, patient able to bend at knees, no signs of nausea or vomiting and adequate PO intake Anesthetic complications: no     Last Vitals:  Filed Vitals:   04/29/16 0300 04/29/16 0523  BP: 121/62 113/64  Pulse: 88 95  Temp: 36.8 C 36.8 C  Resp: 18 20    Last Pain:  Filed Vitals:   04/29/16 0600  PainSc: 7    Pain Goal: Patients Stated Pain Goal: 3 (04/29/16 0554)               Fanny DanceMULLINS,Brockton Mckesson

## 2016-04-29 NOTE — Lactation Note (Signed)
This note was copied from a baby's chart. Lactation Consultation Note  Patient Name: Boy Rosezella FloridaSamantha E Hallum ZOXWR'UToday's Date: 04/29/2016 Reason for consult: Initial assessment Baby at 20 hr of life and mom is worried about supply. She thinks because baby is eating so often and she was not able to pump "a bottle of milk" that she does not have any milk. Discussed baby behavior, feeding frequency, baby belly size, voids, wt loss, breast changes, and nipple care. She stated she can manually express but has "only seen drops". There is a spoon in the room. She stated she wants to ebf this baby but she did not bf her other 2 children. Reviewed risks of formula and pacifiers. Given lactation handouts. Aware of OP services and support group.    Maternal Data Has patient been taught Hand Expression?: Yes Does the patient have breastfeeding experience prior to this delivery?: Yes  Feeding Feeding Type: Bottle Fed - Formula Nipple Type: Slow - flow Length of feed: 15 min  LATCH Score/Interventions                      Lactation Tools Discussed/Used WIC Program: No   Consult Status Consult Status: Follow-up Date: 04/30/16 Follow-up type: In-patient    Rulon Eisenmengerlizabeth E Dannelle Rhymes 04/29/2016, 4:32 PM

## 2016-04-30 LAB — RH IG WORKUP (INCLUDES ABO/RH)
ABO/RH(D): A NEG
Fetal Screen: NEGATIVE
GESTATIONAL AGE(WKS): 37
UNIT DIVISION: 0

## 2016-04-30 NOTE — Lactation Note (Signed)
This note was copied from a baby's chart. Lactation Consultation Note: mom reports baby has been nursing well. He is asleep in dad's arms at present. LS 9 by RN. States she offered formula once but he did not like it. Reports one nipple is tender- reviewed hand expression and applying EBM to nipple after nursing or coconut oil. No questions at present. Reviewed our phone number to call with questions/concerns.    Patient Name: Jill Vaughn ZOXWR'UToday's Date: 04/30/2016 Reason for consult: Follow-up assessment   Maternal Data Formula Feeding for Exclusion: No Has patient been taught Hand Expression?: Yes Does the patient have breastfeeding experience prior to this delivery?: Yes  Feeding    LATCH Score/Interventions                      Lactation Tools Discussed/Used     Consult Status Consult Status: Complete    Pamelia HoitWeeks, Josefa Syracuse D 04/30/2016, 8:01 AM

## 2016-04-30 NOTE — Progress Notes (Signed)
Patient and baby discharged to home.  Discharge summaries and Baby and Me reviewed with patient and FOB using teach back.  No questions or concerns voiced regarding home care needs.

## 2016-04-30 NOTE — Progress Notes (Signed)
Patient is eating, ambulating, voiding.  Pain control is good.  Filed Vitals:   04/29/16 0300 04/29/16 0523 04/29/16 1906 04/30/16 0455  BP: 121/62 113/64 110/61 121/71  Pulse: 88 95 84 86  Temp: 98.2 F (36.8 C) 98.2 F (36.8 C) 98.4 F (36.9 C) 98 F (36.7 C)  TempSrc: Oral Oral Oral Oral  Resp: 18 20 16 18   Height:      Weight:      SpO2: 98%  100%     Fundus firm Perineum without swelling.  Lab Results  Component Value Date   WBC 13.0* 04/29/2016   HGB 10.4* 04/29/2016   HCT 31.7* 04/29/2016   MCV 85.0 04/29/2016   PLT 260 04/29/2016    --/--/A NEG (06/03 0528)/RI  A/P Post partum day 2.  Routine care.  Expect d/c today.  S/p Rhogam.  Prestina Raigoza A

## 2016-04-30 NOTE — Anesthesia Postprocedure Evaluation (Signed)
Anesthesia Post Note  Patient: Rosezella FloridaSamantha E Kern  Procedure(s) Performed: * No procedures listed *  Patient location during evaluation: Other Anesthesia Type: Epidural Level of consciousness: awake Pain management: pain level controlled Vital Signs Assessment: post-procedure vital signs reviewed and stable Respiratory status: spontaneous breathing Cardiovascular status: stable Postop Assessment: no headache, no backache, epidural receding, patient able to bend at knees, no signs of nausea or vomiting and adequate PO intake Anesthetic complications: no     Last Vitals:  Filed Vitals:   04/29/16 1906 04/30/16 0455  BP: 110/61 121/71  Pulse: 84 86  Temp: 36.9 C 36.7 C  Resp: 16 18    Last Pain:  Filed Vitals:   04/30/16 0824  PainSc: 0-No pain   Pain Goal: Patients Stated Pain Goal: 3 (04/29/16 0554)               Fanny DanceMULLINS,Geroldine Esquivias

## 2016-05-02 LAB — TYPE AND SCREEN
ABO/RH(D): A NEG
ANTIBODY SCREEN: POSITIVE
DAT, IgG: NEGATIVE
Unit division: 0
Unit division: 0

## 2016-10-18 ENCOUNTER — Ambulatory Visit (INDEPENDENT_AMBULATORY_CARE_PROVIDER_SITE_OTHER): Payer: BLUE CROSS/BLUE SHIELD | Admitting: *Deleted

## 2016-10-18 DIAGNOSIS — N912 Amenorrhea, unspecified: Secondary | ICD-10-CM

## 2016-10-18 DIAGNOSIS — Z3201 Encounter for pregnancy test, result positive: Secondary | ICD-10-CM

## 2016-10-18 LAB — POCT URINE PREGNANCY: Preg Test, Ur: POSITIVE — AB

## 2016-10-18 NOTE — Progress Notes (Signed)
4687w6d EDD 06/21/2017 Patient needs to schedule a NOB

## 2016-11-15 ENCOUNTER — Encounter: Payer: Self-pay | Admitting: Certified Nurse Midwife

## 2016-11-15 ENCOUNTER — Ambulatory Visit (INDEPENDENT_AMBULATORY_CARE_PROVIDER_SITE_OTHER): Payer: BLUE CROSS/BLUE SHIELD | Admitting: Certified Nurse Midwife

## 2016-11-15 VITALS — BP 115/77 | HR 97 | Wt 148.0 lb

## 2016-11-15 DIAGNOSIS — Z124 Encounter for screening for malignant neoplasm of cervix: Secondary | ICD-10-CM | POA: Diagnosis not present

## 2016-11-15 DIAGNOSIS — Z3480 Encounter for supervision of other normal pregnancy, unspecified trimester: Secondary | ICD-10-CM | POA: Insufficient documentation

## 2016-11-15 DIAGNOSIS — Z3481 Encounter for supervision of other normal pregnancy, first trimester: Secondary | ICD-10-CM

## 2016-11-15 DIAGNOSIS — O09299 Supervision of pregnancy with other poor reproductive or obstetric history, unspecified trimester: Secondary | ICD-10-CM | POA: Insufficient documentation

## 2016-11-15 DIAGNOSIS — Z113 Encounter for screening for infections with a predominantly sexual mode of transmission: Secondary | ICD-10-CM | POA: Diagnosis not present

## 2016-11-15 DIAGNOSIS — O09291 Supervision of pregnancy with other poor reproductive or obstetric history, first trimester: Secondary | ICD-10-CM

## 2016-11-15 DIAGNOSIS — Z1151 Encounter for screening for human papillomavirus (HPV): Secondary | ICD-10-CM

## 2016-11-15 MED ORDER — VITAFOL GUMMIES 3.33-0.333-34.8 MG PO CHEW: 3.0000 | CHEWABLE_TABLET | Freq: Every day | ORAL | 12 refills | Status: DC

## 2016-11-15 NOTE — Progress Notes (Signed)
Subjective:    Jill Vaughn is being seen today for her first obstetrical visit.  This is a planned pregnancy. She is at Unknown gestation. Her obstetrical history is significant for obesity and pre-eclampsia. Relationship with FOB: spouse, living together. Patient does intend to breast feed. Pregnancy history fully reviewed.  The information documented in the HPI was reviewed and verified.  Menstrual History: OB History    Gravida Para Term Preterm AB Living   4 3 3     3    SAB TAB Ectopic Multiple Live Births         0 3       Patient's last menstrual period was 09/14/2016.    Past Medical History:  Diagnosis Date  . Kidney stones   . No pertinent past medical history     Past Surgical History:  Procedure Laterality Date  . MOUTH SURGERY       (Not in a hospital admission) Allergies  Allergen Reactions  . Augmentin [Amoxicillin-Pot Clavulanate] Nausea And Vomiting    Has patient had a PCN reaction causing immediate rash, facial/tongue/throat swelling, SOB or lightheadedness with hypotension: No Has patient had a PCN reaction causing severe rash involving mucus membranes or skin necrosis: No Has patient had a PCN reaction that required hospitalization No Has patient had a PCN reaction occurring within the last 10 years: No If all of the above answers are "NO", then may proceed with Cephalosporin use.     Social History  Substance Use Topics  . Smoking status: Never Smoker  . Smokeless tobacco: Never Used  . Alcohol use No    Family History  Problem Relation Age of Onset  . Cancer Mother   . Heart disease Mother   . Diabetes Mother      Review of Systems Constitutional: negative for weight loss Gastrointestinal: negative for vomiting Genitourinary:negative for genital lesions and vaginal discharge and dysuria Musculoskeletal:negative for back pain Behavioral/Psych: negative for abusive relationship, depression, illegal drug usage and tobacco use     Objective:    BP 115/77   Pulse 97   Wt 148 lb (67.1 kg)   LMP 09/14/2016   BMI 28.90 kg/m  General Appearance:    Alert, cooperative, no distress, appears stated age  Head:    Normocephalic, without obvious abnormality, atraumatic  Eyes:    PERRL, conjunctiva/corneas clear, EOM's intact, fundi    benign, both eyes  Ears:    Normal TM's and external ear canals, both ears  Nose:   Nares normal, septum midline, mucosa normal, no drainage    or sinus tenderness  Throat:   Lips, mucosa, and tongue normal; teeth and gums normal  Neck:   Supple, symmetrical, trachea midline, no adenopathy;    thyroid:  no enlargement/tenderness/nodules; no carotid   bruit or JVD  Back:     Symmetric, no curvature, ROM normal, no CVA tenderness  Lungs:     Clear to auscultation bilaterally, respirations unlabored  Chest Wall:    No tenderness or deformity   Heart:    Regular rate and rhythm, S1 and S2 normal, no murmur, rub   or gallop  Breast Exam:    No tenderness, masses, or nipple abnormality  Abdomen:     Soft, non-tender, bowel sounds active all four quadrants,    no masses, no organomegaly  Genitalia:    Normal female without lesion, discharge or tenderness  Extremities:   Extremities normal, atraumatic, no cyanosis or edema  Pulses:   2+  and symmetric all extremities  Skin:   Skin color, texture, turgor normal, no rashes or lesions  Lymph nodes:   Cervical, supraclavicular, and axillary nodes normal  Neurologic:   CNII-XII intact, normal strength, sensation and reflexes    throughout         Cervix:   Long, thick, closed and posterior.  FHR: seen with sonosite: CRL c/w LMP 3433w2d.          Fundus less than U.     Lab Review Urine pregnancy test Labs reviewed yes Radiologic studies reviewed no Assessment:    Pregnancy at 6565w6d  Nausea in pregnancy  H/O preeclampsia Plan:      Prenatal vitamins.  Counseling provided regarding continued use of seat belts, cessation of alcohol consumption,  smoking or use of illicit drugs; infection precautions i.e., influenza/TDAP immunizations, toxoplasmosis,CMV, parvovirus, listeria and varicella; workplace safety, exercise during pregnancy; routine dental care, safe medications, sexual activity, hot tubs, saunas, pools, travel, caffeine use, fish and methlymercury, potential toxins, hair treatments, varicose veins Weight gain recommendations per IOM guidelines reviewed: underweight/BMI< 18.5--> gain 28 - 40 lbs; normal weight/BMI 18.5 - 24.9--> gain 25 - 35 lbs; overweight/BMI 25 - 29.9--> gain 15 - 25 lbs; obese/BMI >30->gain  11 - 20 lbs Problem list reviewed and updated. FIRST/CF mutation testing/NIPT/QUAD SCREEN/fragile X/Ashkenazi Jewish population testing/Spinal muscular atrophy discussed: requested. Role of ultrasound in pregnancy discussed; fetal survey: requested. Amniocentesis discussed: not indicated. VBAC calculator score: VBAC consent form provided Meds ordered this encounter  Medications  . Prenatal Vit-Fe Phos-FA-Omega (VITAFOL GUMMIES) 3.33-0.333-34.8 MG CHEW    Sig: Chew 3 tablets by mouth at bedtime.    Dispense:  90 tablet    Refill:  12   Orders Placed This Encounter  Procedures  . Culture, OB Urine  . TSH  . Hemoglobinopathy evaluation  . Varicella zoster antibody, IgG  . ToxASSURE Select 13 (MW), Urine  . Hemoglobin A1c  . Obstetric Panel, Including HIV  . Cystic Fibrosis Mutation 97  . NuSwab Vaginitis Plus (VG+)    Follow up in 4 weeks. 50% of 30 min visit spent on counseling and coordination of care.

## 2016-11-19 LAB — CULTURE, OB URINE

## 2016-11-19 LAB — URINE CULTURE, OB REFLEX

## 2016-11-21 ENCOUNTER — Other Ambulatory Visit: Payer: Self-pay | Admitting: Certified Nurse Midwife

## 2016-11-21 DIAGNOSIS — B3731 Acute candidiasis of vulva and vagina: Secondary | ICD-10-CM

## 2016-11-21 DIAGNOSIS — B373 Candidiasis of vulva and vagina: Secondary | ICD-10-CM

## 2016-11-21 LAB — NUSWAB VAGINITIS PLUS (VG+)
Candida albicans, NAA: POSITIVE — AB
Candida glabrata, NAA: NEGATIVE
Chlamydia trachomatis, NAA: NEGATIVE
NEISSERIA GONORRHOEAE, NAA: NEGATIVE
Trich vag by NAA: NEGATIVE

## 2016-11-21 MED ORDER — TERCONAZOLE 0.8 % VA CREA: 1.0000 | TOPICAL_CREAM | Freq: Every day | VAGINAL | 0 refills | Status: DC

## 2016-11-22 LAB — CYTOLOGY - PAP: Diagnosis: NEGATIVE

## 2016-11-24 ENCOUNTER — Other Ambulatory Visit: Payer: Self-pay | Admitting: Certified Nurse Midwife

## 2016-11-24 LAB — OBSTETRIC PANEL, INCLUDING HIV
ANTIBODY SCREEN: NEGATIVE
BASOS: 0 %
Basophils Absolute: 0 10*3/uL (ref 0.0–0.2)
EOS (ABSOLUTE): 0 10*3/uL (ref 0.0–0.4)
EOS: 0 %
HEMATOCRIT: 40.6 % (ref 34.0–46.6)
HEMOGLOBIN: 13.6 g/dL (ref 11.1–15.9)
HIV SCREEN 4TH GENERATION: NONREACTIVE
Hepatitis B Surface Ag: NEGATIVE
IMMATURE GRANS (ABS): 0 10*3/uL (ref 0.0–0.1)
Immature Granulocytes: 0 %
LYMPHS ABS: 1.2 10*3/uL (ref 0.7–3.1)
LYMPHS: 15 %
MCH: 29.5 pg (ref 26.6–33.0)
MCHC: 33.5 g/dL (ref 31.5–35.7)
MCV: 88 fL (ref 79–97)
Monocytes Absolute: 0.5 10*3/uL (ref 0.1–0.9)
Monocytes: 6 %
NEUTROS ABS: 6.1 10*3/uL (ref 1.4–7.0)
Neutrophils: 79 %
Platelets: 296 10*3/uL (ref 150–379)
RBC: 4.61 x10E6/uL (ref 3.77–5.28)
RDW: 14.4 % (ref 12.3–15.4)
RH TYPE: NEGATIVE
RPR Ser Ql: NONREACTIVE
Rubella Antibodies, IGG: 10.6 index (ref >0.99)
WBC: 7.8 10*3/uL (ref 3.4–10.8)

## 2016-11-24 LAB — HEMOGLOBINOPATHY EVALUATION
HEMOGLOBIN A2 QUANTITATION: 2.9 % (ref 1.8–3.2)
HGB C: 0 %
HGB S: 0 %
HGB VARIANT: 0 %
Hemoglobin F Quantitation: 0 % (ref 0.0–2.0)
Hgb A: 97.1 % (ref 96.4–98.8)

## 2016-11-24 LAB — TOXASSURE SELECT 13 (MW), URINE

## 2016-11-24 LAB — HEMOGLOBIN A1C
Est. average glucose Bld gHb Est-mCnc: 91 mg/dL
Hgb A1c MFr Bld: 4.8 % (ref 4.8–5.6)

## 2016-11-24 LAB — CYSTIC FIBROSIS MUTATION 97: GENE DIS ANAL CARRIER INTERP BLD/T-IMP: NOT DETECTED

## 2016-11-24 LAB — TSH: TSH: 0.036 u[IU]/mL — ABNORMAL LOW (ref 0.450–4.500)

## 2016-11-24 LAB — VARICELLA ZOSTER ANTIBODY, IGG: VARICELLA: 611 {index} (ref >165)

## 2016-11-27 NOTE — L&D Delivery Note (Signed)
25 y.o. [id number] at 3619w2d de40livered a viable female infant in cephalic, ROA position. Loose nuchal cord, baby delivered through cord and subsequently easily reduced. Anterior shoulder delivered with ease. 60 sec delayed cord clamping. Cord clamped x2 and cut. Placenta delivered spontaneously intact, with 3VC. Fundus firm on exam with massage and pitocin. Good hemostasis noted.  Anesthesia: None Laceration: mild left labial abrasion does not require sutures Suture: None Good hemostasis noted. EBL: 50 cc  Mom and baby recovering in LDR.    Apgars: APGAR (1 MIN): 9   APGAR (5 MINS): 9   APGAR (10 MINS):   Weight: Pending skin to skin  Sponge and instrument count were correct x2. Placenta sent to L&D.  Howard PouchLauren Feng, MD PGY-2 Family Medicine 06/02/2017, 10:26 PM  OB FELLOW DELIVERY ATTESTATION  I was gloved and present for the delivery in its entirety, and I agree with the above resident's note.    Ernestina PennaNicholas Deema Juncaj, MD 11:48 PM

## 2016-12-13 ENCOUNTER — Encounter: Payer: BLUE CROSS/BLUE SHIELD | Admitting: Certified Nurse Midwife

## 2017-01-01 ENCOUNTER — Ambulatory Visit (INDEPENDENT_AMBULATORY_CARE_PROVIDER_SITE_OTHER): Payer: BLUE CROSS/BLUE SHIELD | Admitting: Certified Nurse Midwife

## 2017-01-01 VITALS — BP 117/81 | HR 98 | Wt 150.0 lb

## 2017-01-01 DIAGNOSIS — Z3480 Encounter for supervision of other normal pregnancy, unspecified trimester: Secondary | ICD-10-CM

## 2017-01-01 DIAGNOSIS — R7989 Other specified abnormal findings of blood chemistry: Secondary | ICD-10-CM

## 2017-01-01 DIAGNOSIS — O09299 Supervision of pregnancy with other poor reproductive or obstetric history, unspecified trimester: Secondary | ICD-10-CM

## 2017-01-01 DIAGNOSIS — R946 Abnormal results of thyroid function studies: Secondary | ICD-10-CM

## 2017-01-01 DIAGNOSIS — O09292 Supervision of pregnancy with other poor reproductive or obstetric history, second trimester: Secondary | ICD-10-CM

## 2017-01-01 NOTE — Progress Notes (Signed)
   PRENATAL VISIT NOTE  Subjective:  Jill Vaughn is a 26 y.o. [phone] at 8296w4d being seen today for ongoing prenatal care.  She is currently monitored for the following issues for this low-risk pregnancy and has Supervision of other normal pregnancy, antepartum and Hx of preeclampsia, prior pregnancy, currently pregnant on her problem list.  Patient reports no complaints.  Contractions: Not present. Vag. Bleeding: None.  Movement: Present. Denies leaking of fluid.   The following portions of the patient's history were reviewed and updated as appropriate: allergies, current medications, past family history, past medical history, past social history, past surgical history and problem list. Problem list updated.  Objective:   Vitals:   01/01/17 0950  BP: 117/81  Pulse: 98  Weight: 150 lb (68 kg)    Fetal Status:     Movement: Present     General:  Alert, oriented and cooperative. Patient is in no acute distress.  Skin: Skin is warm and dry. No rash noted.   Cardiovascular: Normal heart rate noted  Respiratory: Normal respiratory effort, no problems with respiration noted  Abdomen: Soft, gravid, appropriate for gestational age. Pain/Pressure: Absent     Pelvic:  Cervical exam deferred        Extremities: Normal range of motion.     Mental Status: Normal mood and affect. Normal behavior. Normal judgment and thought content.   Assessment and Plan:  Pregnancy: G4P3003 at 9296w4d  1. Low TSH level  - Thyroid Profile  2. Supervision of other normal pregnancy, antepartum  - MaterniT21 PLUS Core+SCA - AFP, Serum, Open Spina Bifida - US MFM OB COMP + 14 WK; Future  3. Hx of preeclampsia, prior pregnancy, currently pregnant    Start on baby ASA    Preterm labor symptoms and general obstetric precautions including but not limited to vaginal bleeding, contractions, leaking of fluid and fetal movement were reviewed in detail with the patient. Please refer to After Visit Summary for  other counseling recommendations.  Return in about 4 weeks (around 01/29/2017) for ROB.   Roe Coombsachelle A Denney, CNM

## 2017-01-01 NOTE — Progress Notes (Signed)
Pt states she feels flutters.

## 2017-01-07 LAB — AFP, SERUM, OPEN SPINA BIFIDA
AFP MoM: 1.15
AFP Value: 34.2 ng/mL
Gest. Age on Collection Date: 15.4 wk
Maternal Age At EDD: 25.8 a
OSBR Risk 1 IN: 7603
Test Results:: NEGATIVE
Weight: 150 [lb_av]

## 2017-01-07 LAB — THYROID PANEL
Free Thyroxine Index: 1 — ABNORMAL LOW (ref 1.2–4.9)
T3 UPTAKE RATIO: 12 % — AB (ref 24–39)
T4 TOTAL: 8.3 ug/dL (ref 4.5–12.0)

## 2017-01-08 ENCOUNTER — Other Ambulatory Visit: Payer: Self-pay | Admitting: Certified Nurse Midwife

## 2017-01-08 DIAGNOSIS — Z3480 Encounter for supervision of other normal pregnancy, unspecified trimester: Secondary | ICD-10-CM

## 2017-01-08 LAB — MATERNIT21 PLUS CORE+SCA
CHROMOSOME 13: NEGATIVE
CHROMOSOME 18: NEGATIVE
CHROMOSOME 21: NEGATIVE
Y CHROMOSOME: NOT DETECTED

## 2017-01-14 ENCOUNTER — Encounter (HOSPITAL_COMMUNITY): Payer: Self-pay

## 2017-01-14 ENCOUNTER — Inpatient Hospital Stay (HOSPITAL_COMMUNITY)
Admission: AD | Admit: 2017-01-14 | Discharge: 2017-01-14 | Disposition: A | Discharge status: Home / Self Care | Payer: BLUE CROSS/BLUE SHIELD | Source: Ambulatory Visit | Attending: Obstetrics & Gynecology | Admitting: Obstetrics & Gynecology

## 2017-01-14 DIAGNOSIS — R519 Headache, unspecified: Secondary | ICD-10-CM

## 2017-01-14 DIAGNOSIS — O99612 Diseases of the digestive system complicating pregnancy, second trimester: Secondary | ICD-10-CM

## 2017-01-14 DIAGNOSIS — R51 Headache: Secondary | ICD-10-CM | POA: Insufficient documentation

## 2017-01-14 DIAGNOSIS — Z87442 Personal history of urinary calculi: Secondary | ICD-10-CM | POA: Diagnosis not present

## 2017-01-14 DIAGNOSIS — O26892 Other specified pregnancy related conditions, second trimester: Secondary | ICD-10-CM

## 2017-01-14 DIAGNOSIS — R0603 Acute respiratory distress: Secondary | ICD-10-CM | POA: Insufficient documentation

## 2017-01-14 DIAGNOSIS — Z3A17 17 weeks gestation of pregnancy: Secondary | ICD-10-CM | POA: Diagnosis not present

## 2017-01-14 DIAGNOSIS — O99512 Diseases of the respiratory system complicating pregnancy, second trimester: Secondary | ICD-10-CM | POA: Insufficient documentation

## 2017-01-14 DIAGNOSIS — K219 Gastro-esophageal reflux disease without esophagitis: Secondary | ICD-10-CM

## 2017-01-14 LAB — URINALYSIS, ROUTINE W REFLEX MICROSCOPIC
Bilirubin Urine: NEGATIVE
Glucose, UA: NEGATIVE mg/dL
Hgb urine dipstick: NEGATIVE
Ketones, ur: 5 mg/dL — AB
Nitrite: NEGATIVE
PH: 5 (ref 5.0–8.0)
Protein, ur: NEGATIVE mg/dL
SPECIFIC GRAVITY, URINE: 1.018 (ref 1.005–1.030)

## 2017-01-14 MED ORDER — LANSOPRAZOLE 15 MG PO CPDR: 15.0000 mg | DELAYED_RELEASE_CAPSULE | Freq: Every day | ORAL | 0 refills | Status: DC

## 2017-01-14 NOTE — MAU Note (Signed)
Pt presents to MAU with c/o "difficulty breathing." pt reports it started Friday. Difficulty comes and goes and nothing makes it better or worse. Denies any recent sicknesses. Pt thinks that she gets a headache with the SOB.

## 2017-01-14 NOTE — Discharge Instructions (Signed)
Switch medication to see if a different medication for heartburn will help relieve your symptoms. Your breathing is normal and you are getting oxygen at a great level for you and your baby. Start Prevacid 15 mg PO q day.  Stop taking Zantac. Call the office and move up your appointment if you are not doing well. Drink 8 glasses of water every day. Take Tylenol 325 mg 2 tablets by mouth every 4 hours if needed for pain.

## 2017-01-14 NOTE — MAU Provider Note (Signed)
History     CSN: [id number]  Arrival date and time: 01/14/17 1251   First Provider Initiated Contact with Patient 01/14/17 1329      Chief Complaint  Patient presents with  . Respiratory Distress   HPI Jill Vaughn 26 y.o.  695w3d  Comes to MAU with a feeling of being hard to take a breath.  It comes and goes and she has noticed it since Friday.  Today when she woke up just before coming to the hospital, it was still there.  States that it is not as easy to breathe.  Reports she does have headaches from time to time and today a mild headache started while she was here at the hospital.  She has not had anything to eat or drink yet today.  [place]    OB History    Gravida Para Term Preterm AB Living   4 3 3     3    SAB TAB Ectopic Multiple Live Births         0 3      Past Medical History:  Diagnosis Date  . Kidney stones   . No pertinent past medical history     Past Surgical History:  Procedure Laterality Date  . MOUTH SURGERY      Family History  Problem Relation Age of Onset  . Cancer Mother   . Heart disease Mother   . Diabetes Mother     Social History  Substance Use Topics  . Smoking status: Never Smoker  . Smokeless tobacco: Never Used  . Alcohol use No    Allergies:  Allergies  Allergen Reactions  . Augmentin [Amoxicillin-Pot Clavulanate] Nausea And Vomiting    Has patient had a PCN reaction causing immediate rash, facial/tongue/throat swelling, SOB or lightheadedness with hypotension: No Has patient had a PCN reaction causing severe rash involving mucus membranes or skin necrosis: No Has patient had a PCN reaction that required hospitalization No Has patient had a PCN reaction occurring within the last 10 years: No If all of the above answers are "NO", then may proceed with Cephalosporin use.     Prescriptions Prior to Admission  Medication Sig Dispense Refill Last Dose  . Prenatal Vit-Fe Fumarate-FA (PRENATAL  MULTIVITAMIN) TABS tablet Take 1 tablet by mouth daily.    Taking  . Prenatal Vit-Fe Phos-FA-Omega (VITAFOL GUMMIES) 3.33-0.333-34.8 MG CHEW Chew 3 tablets by mouth at bedtime. (Patient not taking: Reported on 01/01/2017) 90 tablet 12 Not Taking  . ranitidine (ZANTAC) 150 MG tablet Take 150 mg by mouth 2 (two) times daily as needed for heartburn.    Taking    Review of Systems  Constitutional: Negative for fever.  HENT: Negative for congestion, postnasal drip, rhinorrhea, sneezing, sore throat and trouble swallowing.   Respiratory: Negative for wheezing.        Reports it is difficult to breathe - is aware of breathing and it is not as automatic as it usually is.  Cardiovascular: Negative for chest pain and leg swelling.  Gastrointestinal: Negative for nausea and vomiting.  Neurological: Positive for headaches. Negative for dizziness and syncope.   Physical Exam   Blood pressure 125/58, pulse 108, temperature 99.1 F (37.3 C), temperature source Oral, resp. rate 18, last menstrual period 09/14/2016, SpO2 100 %, unknown if currently breastfeeding.  Physical Exam  Nursing note and vitals reviewed. Constitutional: She is oriented to person, place, and time. She appears well-developed and well-nourished.  HENT:  Head:  Normocephalic.  Eyes: EOM are normal.  Neck: Neck supple.  Cardiovascular: Normal rate and regular rhythm.   No murmur heard. Respiratory: Effort normal and breath sounds normal. No respiratory distress. She has no wheezes.  GI: Soft. There is no tenderness.  Fundus palpated just under the umbilicus FHT by doppler by RN  Musculoskeletal: Normal range of motion.  Neurological: She is alert and oriented to person, place, and time.  Skin: Skin is warm and dry.  Psychiatric: She has a normal mood and affect.    MAU Course  Procedures  MDM 1350  Consult with Dr. Erin Fulling re: plan of care.  While she is at higher risk of embolism, vital signs are stable and O2 sat is  100%.  Client is very comfortable and at ease.  Will treat with different medication for heartburn and see if sensation in her chest improves.  Did not eat today, but yesterday had a fried chick-fil-a sandwich and some fries.  Denies any heartburn.  Assessment and Plan  Pregnant at [redacted] weeks, with a feeling of having difficulty breathing for 3 days, has been taking Zantac 150 mg PO BID Mild headache  Plan Will switch medication to see if a different medication for heartburn will help relieve her symptoms. Will start Prevacid 15 mg PO q day. Call the office and move up your appointment if you are not doing well. Drink 8 glasses of water every day. Take Tylenol 325 mg 2 tablets by mouth every 4 hours if needed for pain.   Nachelle Negrette L Derward Marple 01/14/2017, 1:44 PM

## 2017-01-14 NOTE — MAU Note (Signed)
Urine in lab 

## 2017-01-18 ENCOUNTER — Ambulatory Visit (HOSPITAL_COMMUNITY)
Admission: RE | Admit: 2017-01-18 | Discharge: 2017-01-18 | Disposition: A | Discharge status: Home / Self Care | Payer: BLUE CROSS/BLUE SHIELD | Source: Ambulatory Visit | Attending: Certified Nurse Midwife | Admitting: Certified Nurse Midwife

## 2017-01-18 ENCOUNTER — Other Ambulatory Visit: Payer: Self-pay | Admitting: Certified Nurse Midwife

## 2017-01-18 DIAGNOSIS — O09292 Supervision of pregnancy with other poor reproductive or obstetric history, second trimester: Secondary | ICD-10-CM | POA: Diagnosis not present

## 2017-01-18 DIAGNOSIS — O09299 Supervision of pregnancy with other poor reproductive or obstetric history, unspecified trimester: Secondary | ICD-10-CM

## 2017-01-18 DIAGNOSIS — Z363 Encounter for antenatal screening for malformations: Secondary | ICD-10-CM | POA: Diagnosis present

## 2017-01-18 DIAGNOSIS — Z3A18 18 weeks gestation of pregnancy: Secondary | ICD-10-CM | POA: Diagnosis not present

## 2017-01-18 DIAGNOSIS — Z3480 Encounter for supervision of other normal pregnancy, unspecified trimester: Secondary | ICD-10-CM

## 2017-01-19 ENCOUNTER — Other Ambulatory Visit: Payer: Self-pay | Admitting: Certified Nurse Midwife

## 2017-01-19 DIAGNOSIS — Z3480 Encounter for supervision of other normal pregnancy, unspecified trimester: Secondary | ICD-10-CM

## 2017-01-19 DIAGNOSIS — O09299 Supervision of pregnancy with other poor reproductive or obstetric history, unspecified trimester: Secondary | ICD-10-CM

## 2017-01-29 ENCOUNTER — Ambulatory Visit (INDEPENDENT_AMBULATORY_CARE_PROVIDER_SITE_OTHER): Payer: BLUE CROSS/BLUE SHIELD | Admitting: Certified Nurse Midwife

## 2017-01-29 VITALS — BP 122/79 | HR 106 | Wt 154.4 lb

## 2017-01-29 DIAGNOSIS — O09299 Supervision of pregnancy with other poor reproductive or obstetric history, unspecified trimester: Secondary | ICD-10-CM

## 2017-01-29 DIAGNOSIS — O09292 Supervision of pregnancy with other poor reproductive or obstetric history, second trimester: Secondary | ICD-10-CM

## 2017-01-29 DIAGNOSIS — Z3480 Encounter for supervision of other normal pregnancy, unspecified trimester: Secondary | ICD-10-CM

## 2017-01-29 MED ORDER — ASPIRIN 81 MG PO CHEW: 81.0000 mg | CHEWABLE_TABLET | Freq: Every day | ORAL | 99 refills | Status: DC

## 2017-01-29 NOTE — Progress Notes (Signed)
   PRENATAL VISIT NOTE  Subjective:  Jill Vaughn is a 26 y.o. [phone] at 6866w4d being seen today for ongoing prenatal care.  She is currently monitored for the following issues for this low-risk pregnancy and has Supervision of other normal pregnancy, antepartum and Hx of preeclampsia, prior pregnancy, currently pregnant on her problem list.  Patient reports no complaints.  Contractions: Not present. Vag. Bleeding: None.  Movement: Present. Denies leaking of fluid.   The following portions of the patient's history were reviewed and updated as appropriate: allergies, current medications, past family history, past medical history, past social history, past surgical history and problem list. Problem list updated.  Objective:   Vitals:   01/29/17 0931  BP: 122/79  Pulse: (!) 106  Weight: 154 lb 6.4 oz (70 kg)    Fetal Status: Fetal Heart Rate (bpm): 154 Fundal Height: 19 cm Movement: Present     General:  Alert, oriented and cooperative. Patient is in no acute distress.  Skin: Skin is warm and dry. No rash noted.   Cardiovascular: Normal heart rate noted  Respiratory: Normal respiratory effort, no problems with respiration noted  Abdomen: Soft, gravid, appropriate for gestational age. Pain/Pressure: Absent     Pelvic:  Cervical exam deferred        Extremities: Normal range of motion.  Edema: None  Mental Status: Normal mood and affect. Normal behavior. Normal judgment and thought content.   Assessment and Plan:  Pregnancy: G4P3003 at 6466w4d  1. Supervision of other normal pregnancy, antepartum      Had US on 01/18/17: f/u scheduled for 03/02/17.    2. Hx of preeclampsia, prior pregnancy, currently pregnant     - aspirin 81 MG chewable tablet; Chew 1 tablet (81 mg total) by mouth daily.  Dispense: 30 tablet; Refill: PRN  Preterm labor symptoms and general obstetric precautions including but not limited to vaginal bleeding, contractions, leaking of fluid and fetal movement were  reviewed in detail with the patient. Please refer to After Visit Summary for other counseling recommendations.  Return in about 4 weeks (around 02/26/2017) for ROB.   Roe Coombsachelle A Asharia Lotter, CNM

## 2017-01-29 NOTE — Progress Notes (Signed)
Patient is doing well today.  

## 2017-02-26 ENCOUNTER — Encounter: Payer: Self-pay | Admitting: Certified Nurse Midwife

## 2017-02-26 ENCOUNTER — Ambulatory Visit (INDEPENDENT_AMBULATORY_CARE_PROVIDER_SITE_OTHER): Payer: BLUE CROSS/BLUE SHIELD | Admitting: Certified Nurse Midwife

## 2017-02-26 VITALS — BP 124/79 | HR 109 | Wt 157.6 lb

## 2017-02-26 DIAGNOSIS — O09292 Supervision of pregnancy with other poor reproductive or obstetric history, second trimester: Secondary | ICD-10-CM

## 2017-02-26 DIAGNOSIS — Z3480 Encounter for supervision of other normal pregnancy, unspecified trimester: Secondary | ICD-10-CM

## 2017-02-26 DIAGNOSIS — O09299 Supervision of pregnancy with other poor reproductive or obstetric history, unspecified trimester: Secondary | ICD-10-CM

## 2017-02-26 NOTE — Progress Notes (Signed)
   PRENATAL VISIT NOTE  Subjective:  Jill Vaughn is a 26 y.o. [phone] at [redacted]w[redacted]d being seen today for ongoing prenatal care.  She is currently monitored for the following issues for this low-risk pregnancy and has Supervision of other normal pregnancy, antepartum and Hx of preeclampsia, prior pregnancy, currently pregnant on her problem list.  Patient reports no bleeding, no contractions, no cramping, no leaking and unidentified skin rash that is circular, itchy on neck and chest.  Contractions: Not present. Vag. Bleeding: None.  Movement: Present. Denies leaking of fluid.   The following portions of the patient's history were reviewed and updated as appropriate: allergies, current medications, past family history, past medical history, past social history, past surgical history and problem list. Problem list updated.  Objective:   Vitals:   02/26/17 0933  BP: 124/79  Pulse: (!) 109  Weight: 157 lb 9.6 oz (71.5 kg)    Fetal Status: Fetal Heart Rate (bpm): 155   Movement: Present     General:  Alert, oriented and cooperative. Patient is in no acute distress.  Skin: Skin is warm and dry. No rash noted.   Cardiovascular: Normal heart rate noted  Respiratory: Normal respiratory effort, no problems with respiration noted  Abdomen: Soft, gravid, appropriate for gestational age. Pain/Pressure: Absent     Pelvic:  Cervical exam deferred        Extremities: Normal range of motion.  Edema: None  Mental Status: Normal mood and affect. Normal behavior. Normal judgment and thought content.   Assessment and Plan:  Pregnancy: G4P3003 at [redacted]w[redacted]d  1. Supervision of other normal pregnancy, antepartum     Unidentified skin rash, multiple lesions on neck and abdomen. ?ring worm vs bed bugs or flea bites:  OTC lotrimin and benadryl cream.  Encouraged to wash hands and look for bed bugs.    2. Hx of preeclampsia, prior pregnancy, currently pregnant     Taking daily ASA  Preterm labor symptoms and  general obstetric precautions including but not limited to vaginal bleeding, contractions, leaking of fluid and fetal movement were reviewed in detail with the patient. Please refer to After Visit Summary for other counseling recommendations.  Return in about 3 weeks (around 03/19/2017) for ROB.   Roe Coombs, CNM

## 2017-02-26 NOTE — Patient Instructions (Addendum)
Skin Conditions During Pregnancy Pregnancy affects many parts of your body. One part is your skin. Most skin problems that develop during pregnancy are not serious and are considered a normal part of pregnancy. They go away on their own after the baby is born. Other skin problems may need treatment. What type of skin problems can develop during pregnancy?  Stretch marks. Stretch marks are purple or pink lines on the skin. They may appear on the belly, breasts, thighs, or buttocks. Stretch marks are caused by weight gain that causes the skin to stretch. Stretch marks do not cause problems. Almost all women get them during pregnancy.  Darkening of the skin (hyperpigmentation). The darkening may occur in patches or as a line. Patches may appear on the face, nipples, or genital area. Lines often stretch from the belly button to the pubic area. Hyperpigmentation develops in almost all pregnant women. It is more severe in women with a dark complexion.  Spider angiomas. These are tiny pink or red lines that go out from a center point, like the legs of a spider. Usually, they are on the face, neck, and arms. They do not cause problems. They are most common in women with light complexions.  Palmar erythema. This is a reddening of the palms. It is most common in women with light complexions.  Swelling and redness. This can occur on the face, eyelids, fingers, or toes.  Pruritic urticarial papules and plaques of pregnancy (PUPPP). This is a rash that is itchy, red, and has tiny blisters. The cause is unknown. It usually starts on the abdomen and may affect the arms or legs. It does not affect the face. It usually begins later in pregnancy. About a third of all pregnant women develop this condition. There are no associated problems to the fetus with this rash. Sometimes, oral steroids are used to calm down the itch. The rash clears after the baby is born.  Prurigo of pregnancy. This is a disease in which red  patches and bumps appear on the arms and legs. The cause is unknown. The patches and bumps clear after the baby is born. About a third of pregnant women develop this disease.  Acne. Pimples may develop, including in women who have had clear skin for a long time.  Skin tags. These are small flaps of skin that stick out from the body. They may grow or become darker during pregnancy. They are usually harmless.  Moles. These are flat or slightly raised growths. They are usually round and pink or brown. They may grow or become darker during pregnancy.  Intrahepatic cholestasis of pregnancy. This is a rare condition that causes itchy skin. It may run in families. It increases the risk of complications for the fetus. This condition usually resolves after delivery. It can recur with subsequent pregnancies.  Impetigo herpetiformis. This is a form of a severe skin disease called pustular psoriasis. Usually, delivery is the only method of resolving the condition.  Pruritic folliculitis of pregnancy. This is a rare condition that causes pimple-like skin growths. It develops in the middle or later stages of pregnancy. Its cause is unknown.It usually resolves 2-3 weeks after delivery.  Pemphigoid gestationis. This is a very rare autoimmune disease. It causes a severely itchy rash and blisters. The rash does not appear on the face, scalp, or inside of the mouth. It usually resolves 3 months after delivery. It may recur with subsequent pregnancies. Some pre-existing skin conditions, such as atopic dermatitis, may become worse   during pregnancy. Follow these instructions at home: Different conditions may have different instructions. In general:  Follow all your health care provider's directions about medicines to treat skin problems while you are pregnant. Do not use any over-the-counter medicines (including medicated creams and lotions) until you have checked with your health care provider. Many medicines are not  safe to use when you are pregnant.  Avoid time in the sun. This will help keep your skin from darkening. When you must be outside, use sunscreen and wear a hat with a wide brim to protect your face. The sunscreen should have a SPF of at least 15. This may help limit dark spots that develop when the skin is exposed to the sun.  To avoid problems from stretched skin:  Do not sit or stand for long periods of time.  Exercise regularly. This helps keep your skin in good condition.  Use a gentle soap. This helps prevent acne.  Do not get too hot or too sweaty. This makes some skin rashes worse.  Wear loose clothes made of a soft fabric. This prevents skin irritation.  For itching, add oatmeal or cornstarch to your bathwater.  Use a skin moisturizer. Ask your health care provider for suggestions. This information is not intended to replace advice given to you by your health care provider. Make sure you discuss any questions you have with your health care provider. Document Released: 12/16/2010 Document Revised: 04/20/2016 Document Reviewed: 08/25/2013 Elsevier Interactive Patient Education  2017 [employer].  Second Trimester of Pregnancy The second trimester is from week 13 through week 28, month 4 through 6. This is often the time in pregnancy that you feel your best. Often times, morning sickness has lessened or quit. You may have more energy, and you may get hungry more often. Your unborn baby (fetus) is growing rapidly. At the end of the sixth month, he or she is about 9 inches long and weighs about 1 pounds. You will likely feel the baby move (quickening) between 18 and 20 weeks of pregnancy. Follow these instructions at home:  Avoid all smoking, herbs, and alcohol. Avoid drugs not approved by your doctor.  Do not use any tobacco products, including cigarettes, chewing tobacco, and electronic cigarettes. If you need help quitting, ask your doctor. You may get counseling or other  support to help you quit.  Only take medicine as told by your doctor. Some medicines are safe and some are not during pregnancy.  Exercise only as told by your doctor. Stop exercising if you start having cramps.  Eat regular, healthy meals.  Wear a good support bra if your breasts are tender.  Do not use hot tubs, steam rooms, or saunas.  Wear your seat belt when driving.  Avoid raw meat, uncooked cheese, and liter boxes and soil used by cats.  Take your prenatal vitamins.  Take 1500-2000 milligrams of calcium daily starting at the 20th week of pregnancy until you deliver your baby.  Try taking medicine that helps you poop (stool softener) as needed, and if your doctor approves. Eat more fiber by eating fresh fruit, vegetables, and whole grains. Drink enough fluids to keep your pee (urine) clear or pale yellow.  Take warm water baths (sitz baths) to soothe pain or discomfort caused by hemorrhoids. Use hemorrhoid cream if your doctor approves.  If you have puffy, bulging veins (varicose veins), wear support hose. Raise (elevate) your feet for 15 minutes, 3-4 times a day. Limit salt in your diet.  Avoid heavy lifting, wear low heals, and sit up straight.  Rest with your legs raised if you have leg cramps or low back pain.  Visit your dentist if you have not gone during your pregnancy. Use a soft toothbrush to brush your teeth. Be gentle when you floss.  You can have sex (intercourse) unless your doctor tells you not to.  Go to your doctor visits. Get help if:  You feel dizzy.  You have mild cramps or pressure in your lower belly (abdomen).  You have a nagging pain in your belly area.  You continue to feel sick to your stomach (nauseous), throw up (vomit), or have watery poop (diarrhea).  You have bad smelling fluid coming from your vagina.  You have pain with peeing (urination). Get help right away if:  You have a fever.  You are leaking fluid from your  vagina.  You have spotting or bleeding from your vagina.  You have severe belly cramping or pain.  You lose or gain weight rapidly.  You have trouble catching your breath and have chest pain.  You notice sudden or extreme puffiness (swelling) of your face, hands, ankles, feet, or legs.  You have not felt the baby move in over an hour.  You have severe headaches that do not go away with medicine.  You have vision changes. This information is not intended to replace advice given to you by your health care provider. Make sure you discuss any questions you have with your health care provider. Document Released: 02/07/2010 Document Revised: 04/20/2016 Document Reviewed: 01/14/2013 Elsevier Interactive Patient Education  2017 [employer].

## 2017-02-26 NOTE — Progress Notes (Signed)
Patient reports she is doing well 

## 2017-03-02 ENCOUNTER — Other Ambulatory Visit: Payer: Self-pay | Admitting: Certified Nurse Midwife

## 2017-03-02 ENCOUNTER — Ambulatory Visit (HOSPITAL_COMMUNITY)
Admission: RE | Admit: 2017-03-02 | Discharge: 2017-03-02 | Disposition: A | Discharge status: Home / Self Care | Payer: BLUE CROSS/BLUE SHIELD | Source: Ambulatory Visit | Attending: Certified Nurse Midwife | Admitting: Certified Nurse Midwife

## 2017-03-02 DIAGNOSIS — Z3A24 24 weeks gestation of pregnancy: Secondary | ICD-10-CM

## 2017-03-02 DIAGNOSIS — O09299 Supervision of pregnancy with other poor reproductive or obstetric history, unspecified trimester: Secondary | ICD-10-CM

## 2017-03-02 DIAGNOSIS — Z3480 Encounter for supervision of other normal pregnancy, unspecified trimester: Secondary | ICD-10-CM

## 2017-03-02 DIAGNOSIS — Z3492 Encounter for supervision of normal pregnancy, unspecified, second trimester: Secondary | ICD-10-CM | POA: Diagnosis not present

## 2017-03-07 ENCOUNTER — Other Ambulatory Visit: Payer: Self-pay | Admitting: Certified Nurse Midwife

## 2017-03-07 DIAGNOSIS — Z3480 Encounter for supervision of other normal pregnancy, unspecified trimester: Secondary | ICD-10-CM

## 2017-03-11 ENCOUNTER — Encounter (HOSPITAL_COMMUNITY): Payer: Self-pay | Admitting: Emergency Medicine

## 2017-03-11 ENCOUNTER — Emergency Department (HOSPITAL_COMMUNITY)
Admission: EM | Admit: 2017-03-11 | Discharge: 2017-03-11 | Disposition: A | Discharge status: Home / Self Care | Payer: BLUE CROSS/BLUE SHIELD | Attending: Emergency Medicine | Admitting: Emergency Medicine

## 2017-03-11 DIAGNOSIS — Z3A25 25 weeks gestation of pregnancy: Secondary | ICD-10-CM | POA: Diagnosis not present

## 2017-03-11 DIAGNOSIS — O99712 Diseases of the skin and subcutaneous tissue complicating pregnancy, second trimester: Secondary | ICD-10-CM | POA: Insufficient documentation

## 2017-03-11 DIAGNOSIS — R21 Rash and other nonspecific skin eruption: Secondary | ICD-10-CM | POA: Diagnosis not present

## 2017-03-11 DIAGNOSIS — Z7982 Long term (current) use of aspirin: Secondary | ICD-10-CM | POA: Insufficient documentation

## 2017-03-11 NOTE — ED Provider Notes (Signed)
[facility]-EMERGENCY DEPT Provider Note   CSN: [id number] Arrival date & time: 03/11/17  1316    By signing my name below, I, Valentino Saxon, attest that this documentation has been prepared under the direction and in the presence of Lyndel Safe, PA-C. Electronically Signed: Valentino Saxon, ED Scribe. 03/11/17. 3:57 PM.  History   Chief Complaint Chief Complaint  Patient presents with  . Rash   The history is provided by the patient. No language interpreter was used.   HPI Comments: DHANVI BOESEN is a 26 y.o. female with no pertinent PMHx, who presents to the Emergency Department complaining of gradually worsening, constant, generalized abdominal rash onset two weeks ago. Pt notes she is currently [redacted] weeks pregnant. She reports having an increase of "red splotches" that spread towards her neck, back and lower extremities. Pt describes the rashes as a "dry and scaly" appearance. She states the rashes will begin to burn when she becomes sweaty and after applying lotion on them. Pt reports using an OTC anti-fungal cream, twice a day for three days, followed by cortizone with no relief. Per pt, she notes visiting her OB for further evaluation and was told to use OTC petroleum cream. She reports using Aquaphor with no relief. She denies changing soap detergent, lotions, new products or change in recent medication.   Pt denies any pain.  Past Medical History:  Diagnosis Date  . Kidney stones   . No pertinent past medical history     Patient Active Problem List   Diagnosis Date Noted  . Supervision of other normal pregnancy, antepartum 11/15/2016  . Hx of preeclampsia, prior pregnancy, currently pregnant 11/15/2016    Past Surgical History:  Procedure Laterality Date  . MOUTH SURGERY      OB History    Gravida Para Term Preterm AB Living   SAB TAB Ectopic Multiple Live Births         0 3       Home Medications    Prior to Admission medications     Medication Sig Start Date End Date Taking? Authorizing Provider  aspirin 81 MG chewable tablet Chew 1 tablet (81 mg total) by mouth daily. 01/29/17   Rachelle A Denney, CNM  lansoprazole (PREVACID) 15 MG capsule Take 1 capsule (15 mg total) by mouth daily at 12 noon. 01/14/17   Currie Paris, NP  Prenatal Vit-Fe Fumarate-FA (PRENATAL MULTIVITAMIN) TABS tablet Take 1 tablet by mouth daily.     Historical Provider, MD    Family History Family History  Problem Relation Age of Onset  . Cancer Mother   . Heart disease Mother   . Diabetes Mother     Social History Social History  Substance Use Topics  . Smoking status: Never Smoker  . Smokeless tobacco: Never Used  . Alcohol use No     Allergies   Augmentin [amoxicillin-pot clavulanate]   Review of Systems Review of Systems  Skin: Positive for color change (red) and rash (generalized).     Physical Exam Updated Vital Signs BP 117/70 (BP Location: Right Arm)   Pulse (!) 104   Temp 98 F (36.7 C) (Oral)   Resp 16   Ht 5' (1.524 m)   Wt 71.2 kg   LMP 09/14/2016   SpO2 99%   BMI 30.66 kg/m   Physical Exam  Constitutional: She appears well-developed and well-nourished.  Well appearing  HENT:  Head: Normocephalic and atraumatic.  Nose: Nose normal.  Eyes: Conjunctivae are normal.  Neck: Normal range of motion.  Cardiovascular: Normal rate.   Pulmonary/Chest: Effort normal. No respiratory distress.  Normal work of breathing. No respiratory distress noted.   Abdominal: Soft.  Musculoskeletal: Normal range of motion.  Neurological: She is alert.  Skin: Skin is warm. Lesion and rash noted. No petechiae and no purpura noted. Rash is not macular, not papular, not nodular, not pustular, not vesicular and not urticarial.  5 cm x 3 cm lesion along the lower margin of left breast. Back lesions have a crusted appearance, but otherwise appear similar to others.   She has innumerable red, round raised lesions/patches over her  neck, back, chest and spreading down to her legs. No signs of infection, drainage/discharge.    Rash is not consistent with shingles/zoster.    Psychiatric: She has a normal mood and affect. Her behavior is normal.  Nursing note and vitals reviewed.    ED Treatments / Results   DIAGNOSTIC STUDIES: Oxygen Saturation is 99% on RA, normal by my interpretation.    COORDINATION OF CARE: 2:51 PM Discussed treatment plan with pt at bedside which includes f/u with dermatologist and pt agreed to plan.   Labs (all labs ordered are listed, but only abnormal results are displayed) Labs Reviewed - No data to display  EKG  EKG Interpretation None       Radiology No results found.  Procedures Procedures (including critical care time)  Medications Ordered in ED Medications - No data to display   Initial Impression / Assessment and Plan / ED Course  I have reviewed the triage vital signs and the nursing notes.  Pertinent labs & imaging results that were available during my care of the patient were reviewed by me and considered in my medical decision making (see chart for details).    This rash has been going on for over two weeks now and is gradually getting worse.  There does not appear to be any bacterial infection.  Patient denies any difficulty breathing or swallowing.  Pt has a patent airway without stridor and is handling secretions without difficulty; no angioedema. Not tender to touch. No concern for superimposed infection. Patient was instructed to follow up with a dermatologist of her choice and her OB/GYN.    Patient was upset that she did not get a specific diagnosis today, but was given appropriate follow up and accepting that this is not an emergent situation.     At this time there does not appear to be any evidence of an acute emergency medical condition and the patient appears stable for discharge with appropriate outpatient follow up.Diagnosis was discussed with patient  who verbalizes understanding and is agreeable to discharge. Pt case discussed with Dr. Patria Mane who agrees with my plan.     Final Clinical Impressions(s) / ED Diagnoses   Final diagnoses:  Rash    New Prescriptions Discharge Medication List as of 03/11/2017  3:31 PM      I personally performed the services described in this documentation, which was scribed in my presence. The recorded information has been reviewed and is accurate.     Cristina Gong, PA-C 03/11/17 2005    Azalia Bilis, MD 03/11/17 2006

## 2017-03-11 NOTE — ED Triage Notes (Signed)
Pt c/o generalized body rash x 2 weeks. Pt is currently 25 weeks and 2 days pregnant denies any problems with pregnancy. Pt denies use of new products. Pt seen by OB dr at onset of rash and was told to get over the counter petroleum cream. Pt has tried aquafor without relief.

## 2017-03-19 ENCOUNTER — Other Ambulatory Visit: Payer: BLUE CROSS/BLUE SHIELD

## 2017-03-19 ENCOUNTER — Encounter: Payer: Self-pay | Admitting: Certified Nurse Midwife

## 2017-03-19 ENCOUNTER — Ambulatory Visit (INDEPENDENT_AMBULATORY_CARE_PROVIDER_SITE_OTHER): Payer: BLUE CROSS/BLUE SHIELD | Admitting: Certified Nurse Midwife

## 2017-03-19 VITALS — BP 104/70 | HR 93 | Wt 160.0 lb

## 2017-03-19 DIAGNOSIS — O2686 Pruritic urticarial papules and plaques of pregnancy (PUPPP): Secondary | ICD-10-CM

## 2017-03-19 DIAGNOSIS — O09299 Supervision of pregnancy with other poor reproductive or obstetric history, unspecified trimester: Secondary | ICD-10-CM

## 2017-03-19 DIAGNOSIS — O09292 Supervision of pregnancy with other poor reproductive or obstetric history, second trimester: Secondary | ICD-10-CM

## 2017-03-19 DIAGNOSIS — Z3482 Encounter for supervision of other normal pregnancy, second trimester: Secondary | ICD-10-CM

## 2017-03-19 DIAGNOSIS — Z3480 Encounter for supervision of other normal pregnancy, unspecified trimester: Secondary | ICD-10-CM

## 2017-03-19 MED ORDER — RHO D IMMUNE GLOBULIN 1500 UNIT/2ML IJ SOSY
300.0000 ug | PREFILLED_SYRINGE | Freq: Once | INTRAMUSCULAR | Status: AC
  Administered 2017-03-19: 300 ug via INTRAMUSCULAR

## 2017-03-19 MED ORDER — HYDROXYZINE PAMOATE 50 MG PO CAPS: 50.0000 mg | ORAL_CAPSULE | Freq: Three times a day (TID) | ORAL | 0 refills | Status: DC | PRN

## 2017-03-19 NOTE — Progress Notes (Signed)
Patient given Rhogam in [facility]. Tolerated well.  Administrations This Visit    rho (d) immune globulin (RHIG/RHOPHYLAC) injection 300 mcg    Admin Date 03/19/2017 Action Given Dose 300 mcg Route Intramuscular Administered By Maretta Bees, RMA

## 2017-03-19 NOTE — Progress Notes (Signed)
   PRENATAL VISIT NOTE  Subjective:  Jill Vaughn is a 26 y.o. [phone] at [redacted]w[redacted]d being seen today for ongoing prenatal care.  She is currently monitored for the following issues for this low-risk pregnancy and has Supervision of other normal pregnancy, antepartum and Hx of preeclampsia, prior pregnancy, currently pregnant on her problem list.  Patient reports no bleeding, no contractions, no cramping, no leaking and PUPP type rash that is getting worse since last visit.  Contractions: Not present. Vag. Bleeding: None.  Movement: Present. Denies leaking of fluid.   The following portions of the patient's history were reviewed and updated as appropriate: allergies, current medications, past family history, past medical history, past social history, past surgical history and problem list. Problem list updated.  Objective:   Vitals:   03/19/17 0908  BP: 104/70  Pulse: 93  Weight: 160 lb (72.6 kg)    Fetal Status:     Movement: Present     General:  Alert, oriented and cooperative. Patient is in no acute distress.  Skin: Skin is warm and dry. No rash noted.   Cardiovascular: Normal heart rate noted  Respiratory: Normal respiratory effort, no problems with respiration noted  Abdomen: Soft, gravid, appropriate for gestational age. Pain/Pressure: Present     Pelvic:  Cervical exam deferred        Extremities: Normal range of motion.  Edema: None  Mental Status: Normal mood and affect. Normal behavior. Normal judgment and thought content.   Assessment and Plan:  Pregnancy: G4P3003 at [redacted]w[redacted]d  1. Supervision of other normal pregnancy, antepartum      - Glucose Tolerance, 2 Hours w/1 Hour - CBC - HIV antibody - RPR  2. Hx of preeclampsia, prior pregnancy, currently pregnant     Taking ASA daily  3. PUPP (pruritic urticarial papules and plaques of pregnancy)     Dr. Ladean Raya saw rash and agrees with plan.  - hydrOXYzine (VISTARIL) 50 MG capsule; Take 1 capsule (50 mg total) by mouth  3 (three) times daily as needed.  Dispense: 30 capsule; Refill: 0 - Hepatic function panel - Bile acids, total  Preterm labor symptoms and general obstetric precautions including but not limited to vaginal bleeding, contractions, leaking of fluid and fetal movement were reviewed in detail with the patient. Please refer to After Visit Summary for other counseling recommendations.  Return in about 2 weeks (around 04/02/2017) for ROB.   Roe Coombs, CNM

## 2017-03-19 NOTE — Patient Instructions (Addendum)
Pruritic Urticarial Papules and Plaques of Pregnancy When you are pregnant, your body changes in many ways. That includes the skin. Rashes sometimes develop. One skin rash that can happen during pregnancy is called pruritic urticarial papules and plaques of pregnancy (PUPPP). The small red bumps sometimes form large plaques. These are very itchy. The rash usually appears in the last few weeks of pregnancy during the third trimester. Sometimes, it can occur shortly after giving birth. It goes away shortly after your baby is born. It does not harm you or your baby and will not leave scars on your skin. PUPPP is most common in first pregnancies or in those involving more than one baby. It usually will not return during later pregnancies. What are the causes? The exact cause is unknown. However, it may be related to your skin stretching rapidly due to pregnancy. What are the signs or symptoms? PUPPP symptoms include a very itchy rash. The rash often looks red and raised and is most often seen on the abdomen. It can spread to the legs, thighs, or arms. Sometimes tiny blisters form in the center of the rash patches. The skin around the rash is often pale. How is this diagnosed? To decide if you have PUPPP, your health care provider will perform a physical exam and ask questions about your symptoms. He or she may order blood tests to rule out other causes of the rash. How is this treated? The goal is to stop the itching and keep the rash from spreading. Usually, a cream is used to do this. However, treatment varies. Common options include medicines that relieve or lessen itching. Some medicines may be in the form of a cream or ointment, while others you may take by mouth (orally). The medicines are either corticosteroids or antihistamines. Treatment helps nearly all women with this rash. The creams, ointments, or pills should make your skin feel better fairly quickly. Follow these instructions at home:  Only  take over-the-counter or prescription medicines as directed by your health care provider.  Apply any creams as directed by your health care provider.  Do not scratch the rash.  Wear loose clothing.  Keep all follow-up appointments with your health care provider. Contact a health care provider if:  The itching does not go away after treatment.  Your rash continues to spread.  You are unable to sleep because of the irritation. This information is not intended to replace advice given to you by your health care provider. Make sure you discuss any questions you have with your health care provider. Document Released: 02/07/2010 Document Revised: 04/20/2016 Document Reviewed: 04/27/2013 Elsevier Interactive Patient Education  2017 [employer].  Third Trimester of Pregnancy The third trimester is from week 29 through week 42, months 7 through 9. This trimester is when your unborn baby (fetus) is growing very fast. At the end of the ninth month, the unborn baby is about 20 inches in length. It weighs about 6-10 pounds. Follow these instructions at home:  Avoid all smoking, herbs, and alcohol. Avoid drugs not approved by your doctor.  Do not use any tobacco products, including cigarettes, chewing tobacco, and electronic cigarettes. If you need help quitting, ask your doctor. You may get counseling or other support to help you quit.  Only take medicine as told by your doctor. Some medicines are safe and some are not during pregnancy.  Exercise only as told by your doctor. Stop exercising if you start having cramps.  Eat regular, healthy meals.  Wear a good support bra if your breasts are tender.  Do not use hot tubs, steam rooms, or saunas.  Wear your seat belt when driving.  Avoid raw meat, uncooked cheese, and liter boxes and soil used by cats.  Take your prenatal vitamins.  Take 1500-2000 milligrams of calcium daily starting at the 20th week of pregnancy until you deliver your  baby.  Try taking medicine that helps you poop (stool softener) as needed, and if your doctor approves. Eat more fiber by eating fresh fruit, vegetables, and whole grains. Drink enough fluids to keep your pee (urine) clear or pale yellow.  Take warm water baths (sitz baths) to soothe pain or discomfort caused by hemorrhoids. Use hemorrhoid cream if your doctor approves.  If you have puffy, bulging veins (varicose veins), wear support hose. Raise (elevate) your feet for 15 minutes, 3-4 times a day. Limit salt in your diet.  Avoid heavy lifting, wear low heels, and sit up straight.  Rest with your legs raised if you have leg cramps or low back pain.  Visit your dentist if you have not gone during your pregnancy. Use a soft toothbrush to brush your teeth. Be gentle when you floss.  You can have sex (intercourse) unless your doctor tells you not to.  Do not travel far distances unless you must. Only do so with your doctor's approval.  Take prenatal classes.  Practice driving to the hospital.  Pack your hospital bag.  Prepare the baby's room.  Go to your doctor visits. Get help if:  You are not sure if you are in labor or if your water has broken.  You are dizzy.  You have mild cramps or pressure in your lower belly (abdominal).  You have a nagging pain in your belly area.  You continue to feel sick to your stomach (nauseous), throw up (vomit), or have watery poop (diarrhea).  You have bad smelling fluid coming from your vagina.  You have pain with peeing (urination). Get help right away if:  You have a fever.  You are leaking fluid from your vagina.  You are spotting or bleeding from your vagina.  You have severe belly cramping or pain.  You lose or gain weight rapidly.  You have trouble catching your breath and have chest pain.  You notice sudden or extreme puffiness (swelling) of your face, hands, ankles, feet, or legs.  You have not felt the baby move in over  an hour.  You have severe headaches that do not go away with medicine.  You have vision changes. This information is not intended to replace advice given to you by your health care provider. Make sure you discuss any questions you have with your health care provider. Document Released: 02/07/2010 Document Revised: 04/20/2016 Document Reviewed: 01/14/2013 Elsevier Interactive Patient Education  2017 [employer] Skin Conditions During Pregnancy Pregnancy affects many parts of your body. One part is your skin. Most skin problems that develop during pregnancy are not serious and are considered a normal part of pregnancy. They go away on their own after the baby is born. Other skin problems may need treatment. What type of skin problems can develop during pregnancy?  Stretch marks. Stretch marks are purple or pink lines on the skin. They may appear on the belly, breasts, thighs, or buttocks. Stretch marks are caused by weight gain that causes the skin to stretch. Stretch marks do not cause problems. Almost all women get them during pregnancy.  Darkening of the  skin (hyperpigmentation). The darkening may occur in patches or as a line. Patches may appear on the face, nipples, or genital area. Lines often stretch from the belly button to the pubic area. Hyperpigmentation develops in almost all pregnant women. It is more severe in women with a dark complexion.  Spider angiomas. These are tiny pink or red lines that go out from a center point, like the legs of a spider. Usually, they are on the face, neck, and arms. They do not cause problems. They are most common in women with light complexions.  Palmar erythema. This is a reddening of the palms. It is most common in women with light complexions.  Swelling and redness. This can occur on the face, eyelids, fingers, or toes.  Pruritic urticarial papules and plaques of pregnancy (PUPPP). This is a rash that is itchy, red, and has tiny blisters. The  cause is unknown. It usually starts on the abdomen and may affect the arms or legs. It does not affect the face. It usually begins later in pregnancy. About a third of all pregnant women develop this condition. There are no associated problems to the fetus with this rash. Sometimes, oral steroids are used to calm down the itch. The rash clears after the baby is born.  Prurigo of pregnancy. This is a disease in which red patches and bumps appear on the arms and legs. The cause is unknown. The patches and bumps clear after the baby is born. About a third of pregnant women develop this disease.  Acne. Pimples may develop, including in women who have had clear skin for a long time.  Skin tags. These are small flaps of skin that stick out from the body. They may grow or become darker during pregnancy. They are usually harmless.  Moles. These are flat or slightly raised growths. They are usually round and pink or brown. They may grow or become darker during pregnancy.  Intrahepatic cholestasis of pregnancy. This is a rare condition that causes itchy skin. It may run in families. It increases the risk of complications for the fetus. This condition usually resolves after delivery. It can recur with subsequent pregnancies.  Impetigo herpetiformis. This is a form of a severe skin disease called pustular psoriasis. Usually, delivery is the only method of resolving the condition.  Pruritic folliculitis of pregnancy. This is a rare condition that causes pimple-like skin growths. It develops in the middle or later stages of pregnancy. Its cause is unknown.It usually resolves 2-3 weeks after delivery.  Pemphigoid gestationis. This is a very rare autoimmune disease. It causes a severely itchy rash and blisters. The rash does not appear on the face, scalp, or inside of the mouth. It usually resolves 3 months after delivery. It may recur with subsequent pregnancies. Some pre-existing skin conditions, such as atopic  dermatitis, may become worse during pregnancy. Follow these instructions at home: Different conditions may have different instructions. In general:  Follow all your health care provider's directions about medicines to treat skin problems while you are pregnant. Do not use any over-the-counter medicines (including medicated creams and lotions) until you have checked with your health care provider. Many medicines are not safe to use when you are pregnant.  Avoid time in the sun. This will help keep your skin from darkening. When you must be outside, use sunscreen and wear a hat with a wide brim to protect your face. The sunscreen should have a SPF of at least 15. This may help limit dark  spots that develop when the skin is exposed to the sun.  To avoid problems from stretched skin:  Do not sit or stand for long periods of time.  Exercise regularly. This helps keep your skin in good condition.  Use a gentle soap. This helps prevent acne.  Do not get too hot or too sweaty. This makes some skin rashes worse.  Wear loose clothes made of a soft fabric. This prevents skin irritation.  For itching, add oatmeal or cornstarch to your bathwater.  Use a skin moisturizer. Ask your health care provider for suggestions. This information is not intended to replace advice given to you by your health care provider. Make sure you discuss any questions you have with your health care provider. Document Released: 12/16/2010 Document Revised: 04/20/2016 Document Reviewed: 08/25/2013 Elsevier Interactive Patient Education  2017 [employer].

## 2017-03-21 LAB — HEPATIC FUNCTION PANEL
ALBUMIN: 3.1 g/dL — AB (ref 3.5–5.5)
ALT: 7 IU/L (ref 0–32)
AST: 9 IU/L (ref 0–40)
Alkaline Phosphatase: 97 IU/L (ref 39–117)
Total Protein: 5.4 g/dL — ABNORMAL LOW (ref 6.0–8.5)

## 2017-03-21 LAB — BILE ACIDS, TOTAL: Bile Acids Total: 5.2 umol/L (ref 4.7–24.5)

## 2017-03-21 LAB — GLUCOSE TOLERANCE, 2 HOURS W/ 1HR
GLUCOSE, 1 HOUR: 150 mg/dL (ref 65–179)
Glucose, 2 hour: 108 mg/dL (ref 65–152)
Glucose, Fasting: 79 mg/dL (ref 65–91)

## 2017-03-21 LAB — CBC
HEMOGLOBIN: 11.2 g/dL (ref 11.1–15.9)
Hematocrit: 33.7 % — ABNORMAL LOW (ref 34.0–46.6)
MCH: 30.3 pg (ref 26.6–33.0)
MCHC: 33.2 g/dL (ref 31.5–35.7)
MCV: 91 fL (ref 79–97)
Platelets: 276 10*3/uL (ref 150–379)
RBC: 3.7 x10E6/uL — ABNORMAL LOW (ref 3.77–5.28)
RDW: 15 % (ref 12.3–15.4)
WBC: 8 10*3/uL (ref 3.4–10.8)

## 2017-03-21 LAB — HIV ANTIBODY (ROUTINE TESTING W REFLEX): HIV SCREEN 4TH GENERATION: NONREACTIVE

## 2017-03-21 LAB — RPR: RPR: NONREACTIVE

## 2017-04-02 ENCOUNTER — Encounter: Payer: Self-pay | Admitting: Certified Nurse Midwife

## 2017-04-02 ENCOUNTER — Ambulatory Visit (INDEPENDENT_AMBULATORY_CARE_PROVIDER_SITE_OTHER): Payer: BLUE CROSS/BLUE SHIELD | Admitting: Certified Nurse Midwife

## 2017-04-02 VITALS — BP 106/70 | HR 99 | Wt 161.0 lb

## 2017-04-02 DIAGNOSIS — O09299 Supervision of pregnancy with other poor reproductive or obstetric history, unspecified trimester: Secondary | ICD-10-CM

## 2017-04-02 DIAGNOSIS — Z3483 Encounter for supervision of other normal pregnancy, third trimester: Secondary | ICD-10-CM

## 2017-04-02 DIAGNOSIS — Z3480 Encounter for supervision of other normal pregnancy, unspecified trimester: Secondary | ICD-10-CM

## 2017-04-02 DIAGNOSIS — O09293 Supervision of pregnancy with other poor reproductive or obstetric history, third trimester: Secondary | ICD-10-CM

## 2017-04-02 DIAGNOSIS — O2686 Pruritic urticarial papules and plaques of pregnancy (PUPPP): Secondary | ICD-10-CM

## 2017-04-02 NOTE — Progress Notes (Signed)
   PRENATAL VISIT NOTE  Subjective:  Jill Vaughn is a 26 y.o. [phone] at 5715w4d being seen today for ongoing prenatal care.  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  Patient reports no complaints.  Contractions: Not present. Vag. Bleeding: None.  Movement: Present. Denies leaking of fluid.   The following portions of the patient's history were reviewed and updated as appropriate: allergies, current medications, past family history, past medical history, past social history, past surgical history and problem list. Problem list updated.  Objective:   Vitals:   04/02/17 0854  BP: 106/70  Pulse: 99  Weight: 161 lb (73 kg)    Fetal Status:     Movement: Present     General:  Alert, oriented and cooperative. Patient is in no acute distress.  Skin: Skin is warm and dry. No rash noted.   Cardiovascular: Normal heart rate noted  Respiratory: Normal respiratory effort, no problems with respiration noted  Abdomen: Soft, gravid, appropriate for gestational age. Pain/Pressure: Absent     Pelvic:  Cervical exam deferred        Extremities: Normal range of motion.  Edema: None  Mental Status: Normal mood and affect. Normal behavior. Normal judgment and thought content.   Assessment and Plan:  Pregnancy: G4P3003 at 2715w4d  1. Supervision of other normal pregnancy, antepartum      Doing well  2. Hx of preeclampsia, prior pregnancy, currently pregnant     Taking baby ASA  3. 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 Please refer to After Visit Summary for other counseling recommendations.  Return in about 2 weeks (around 04/16/2017) for ROB.   Roe Coombsenney, Shondrea Steinert A, CNM

## 2017-04-02 NOTE — Progress Notes (Signed)
Patient reports good fetal movement, denies pain. 

## 2017-04-16 ENCOUNTER — Ambulatory Visit (INDEPENDENT_AMBULATORY_CARE_PROVIDER_SITE_OTHER): Payer: BLUE CROSS/BLUE SHIELD | Admitting: Certified Nurse Midwife

## 2017-04-16 ENCOUNTER — Encounter: Payer: Self-pay | Admitting: Certified Nurse Midwife

## 2017-04-16 VITALS — BP 117/79 | HR 100 | Wt 160.0 lb

## 2017-04-16 DIAGNOSIS — Z3483 Encounter for supervision of other normal pregnancy, third trimester: Secondary | ICD-10-CM

## 2017-04-16 DIAGNOSIS — O09299 Supervision of pregnancy with other poor reproductive or obstetric history, unspecified trimester: Secondary | ICD-10-CM

## 2017-04-16 DIAGNOSIS — O2686 Pruritic urticarial papules and plaques of pregnancy (PUPPP): Secondary | ICD-10-CM

## 2017-04-16 DIAGNOSIS — Z3480 Encounter for supervision of other normal pregnancy, unspecified trimester: Secondary | ICD-10-CM

## 2017-04-16 DIAGNOSIS — O09293 Supervision of pregnancy with other poor reproductive or obstetric history, third trimester: Secondary | ICD-10-CM

## 2017-04-16 NOTE — Progress Notes (Signed)
Pt states that she has had a few ctx.  Pt hydrated well and rest, ctx resolved.

## 2017-04-16 NOTE — Progress Notes (Signed)
   PRENATAL VISIT NOTE  Subjective:  Jill Vaughn is a 26 y.o. [phone] at 4150w4d being seen today for ongoing prenatal care.  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  Patient reports no complaints.  Contractions: Irregular. Vag. Bleeding: None.  Movement: Present. Denies leaking of fluid.   The following portions of the patient's history were reviewed and updated as appropriate: allergies, current medications, past family history, past medical history, past social history, past surgical history and problem list. Problem list updated.  Objective:   Vitals:   04/16/17 1350  BP: 117/79  Pulse: 100  Weight: 160 lb (72.6 kg)    Fetal Status: Fetal Heart Rate (bpm): 140 Fundal Height: 30 cm Movement: Present     General:  Alert, oriented and cooperative. Patient is in no acute distress.  Skin: Skin is warm and dry. No rash noted.   Cardiovascular: Normal heart rate noted  Respiratory: Normal respiratory effort, no problems with respiration noted  Abdomen: Soft, gravid, appropriate for gestational age. Pain/Pressure: Absent     Pelvic:  Cervical exam deferred        Extremities: Normal range of motion.     Mental Status: Normal mood and affect. Normal behavior. Normal judgment and thought content.   Assessment and Plan:  Pregnancy: G4P3003 at 3450w4d  1. Supervision of other normal pregnancy, antepartum     Doing well.  BTL paperwork completed.  2. Hx of preeclampsia, prior pregnancy, currently pregnant     Taking baby ASA  3. 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 Using hydrocortisone topical ointment.   Preterm labor symptoms and general obstetric precautions including but not limited to vaginal  bleeding, contractions, leaking of fluid and fetal movement were reviewed in detail with the patient. Please refer to After Visit Summary for other counseling recommendations.  Return in about 2 weeks (around 04/30/2017) for ROB.   Roe Coombsachelle A Manav Pierotti, CNM

## 2017-04-30 ENCOUNTER — Ambulatory Visit (INDEPENDENT_AMBULATORY_CARE_PROVIDER_SITE_OTHER): Payer: BLUE CROSS/BLUE SHIELD | Admitting: Certified Nurse Midwife

## 2017-04-30 VITALS — BP 117/78 | HR 120 | Temp 97.9°F | Wt 160.6 lb

## 2017-04-30 DIAGNOSIS — Z3483 Encounter for supervision of other normal pregnancy, third trimester: Secondary | ICD-10-CM

## 2017-04-30 DIAGNOSIS — O26899 Other specified pregnancy related conditions, unspecified trimester: Secondary | ICD-10-CM | POA: Insufficient documentation

## 2017-04-30 DIAGNOSIS — Z6791 Unspecified blood type, Rh negative: Secondary | ICD-10-CM

## 2017-04-30 DIAGNOSIS — Z3480 Encounter for supervision of other normal pregnancy, unspecified trimester: Secondary | ICD-10-CM

## 2017-04-30 DIAGNOSIS — O09299 Supervision of pregnancy with other poor reproductive or obstetric history, unspecified trimester: Secondary | ICD-10-CM

## 2017-04-30 DIAGNOSIS — O09293 Supervision of pregnancy with other poor reproductive or obstetric history, third trimester: Secondary | ICD-10-CM

## 2017-04-30 DIAGNOSIS — O2686 Pruritic urticarial papules and plaques of pregnancy (PUPPP): Secondary | ICD-10-CM

## 2017-04-30 NOTE — Progress Notes (Signed)
Patient reports good fetal movement and contractions that come and go. 

## 2017-04-30 NOTE — Progress Notes (Signed)
   PRENATAL VISIT NOTE  Subjective:  Jill Vaughn is a 26 y.o. [phone] at 3112w4d being seen today for ongoing prenatal care.  She is currently monitored for the following issues for this low-risk pregnancy and has Supervision of other normal pregnancy, antepartum; Hx of preeclampsia, prior pregnancy, currently pregnant; PUPP (pruritic urticarial papules and plaques of pregnancy); and Rh negative status during pregnancy on her problem list.  Patient reports no bleeding, no contractions, no cramping, no leaking and URI. URI symptoms, denies fever, reports nasal congestion, cough, no sputum production  Contractions: Irregular. Vag. Bleeding: None.  Movement: Present. Denies leaking of fluid.   The following portions of the patient's history were reviewed and updated as appropriate: allergies, current medications, past family history, past medical history, past social history, past surgical history and problem list. Problem list updated.  Objective:   Vitals:   04/30/17 1307  BP: 117/78  Pulse: (!) 120  Weight: 160 lb 9.6 oz (72.8 kg)    Fetal Status: Fetal Heart Rate (bpm): 135 Fundal Height: 32 cm Movement: Present     General:  Alert, oriented and cooperative. Patient is in no acute distress.  Skin: Skin is warm and dry. No rash noted.   Cardiovascular: Normal heart rate noted  Respiratory: Normal respiratory effort, no problems with respiration noted  Abdomen: Soft, gravid, appropriate for gestational age. Pain/Pressure: Absent     Pelvic:  Cervical exam deferred        Extremities: Normal range of motion.  Edema: Trace  Mental Status: Normal mood and affect. Normal behavior. Normal judgment and thought content.   Assessment and Plan:  Pregnancy: G4P3003 at 3512w4d  1. Supervision of other normal pregnancy, antepartum      Acute URI  2. 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 Hx of preeclampsia, prior pregnancy, currently pregnant      Taking  baby ASA.  Preterm labor symptoms and general obstetric precautions including but not limited to vaginal bleeding, contractions, leaking of fluid and fetal movement were reviewed in detail with the patient. Please refer to After Visit Summary for other counseling recommendations.  Return in about 2 weeks (around 05/14/2017) for ROB.   Roe Coombsachelle A Denney, CNM

## 2017-05-14 ENCOUNTER — Ambulatory Visit (INDEPENDENT_AMBULATORY_CARE_PROVIDER_SITE_OTHER): Payer: BLUE CROSS/BLUE SHIELD | Admitting: Certified Nurse Midwife

## 2017-05-14 ENCOUNTER — Encounter: Payer: Self-pay | Admitting: Certified Nurse Midwife

## 2017-05-14 VITALS — BP 125/81 | HR 111 | Wt 166.4 lb

## 2017-05-14 DIAGNOSIS — Z3480 Encounter for supervision of other normal pregnancy, unspecified trimester: Secondary | ICD-10-CM

## 2017-05-14 DIAGNOSIS — O26893 Other specified pregnancy related conditions, third trimester: Secondary | ICD-10-CM

## 2017-05-14 DIAGNOSIS — O09299 Supervision of pregnancy with other poor reproductive or obstetric history, unspecified trimester: Secondary | ICD-10-CM

## 2017-05-14 DIAGNOSIS — Z3483 Encounter for supervision of other normal pregnancy, third trimester: Secondary | ICD-10-CM

## 2017-05-14 DIAGNOSIS — Z6791 Unspecified blood type, Rh negative: Secondary | ICD-10-CM

## 2017-05-14 NOTE — Progress Notes (Signed)
Patient is doing well today.  

## 2017-05-14 NOTE — Progress Notes (Signed)
   PRENATAL VISIT NOTE  Subjective:  Jill Vaughn is a 26 y.o. [phone] at 1630w4d being seen today for ongoing prenatal care.  She is currently monitored for the following issues for this low-risk pregnancy and has Supervision of other normal pregnancy, antepartum; Hx of preeclampsia, prior pregnancy, currently pregnant; PUPP (pruritic urticarial papules and plaques of pregnancy); and Rh negative status during pregnancy on her problem list.  Patient reports no complaints.  Contractions: Not present. Vag. Bleeding: None.  Movement: Present. Denies leaking of fluid.   The following portions of the patient's history were reviewed and updated as appropriate: allergies, current medications, past family history, past medical history, past social history, past surgical history and problem list. Problem list updated.  Objective:   Vitals:   05/14/17 1045  BP: 125/81  Pulse: (!) 111  Weight: 166 lb 6.4 oz (75.5 kg)    Fetal Status: Fetal Heart Rate (bpm): 140 Fundal Height: 34 cm Movement: Present     General:  Alert, oriented and cooperative. Patient is in no acute distress.  Skin: Skin is warm and dry. No rash noted.   Cardiovascular: Normal heart rate noted  Respiratory: Normal respiratory effort, no problems with respiration noted  Abdomen: Soft, gravid, appropriate for gestational age. Pain/Pressure: Absent     Pelvic:  Cervical exam deferred        Extremities: Normal range of motion.  Edema: None  Mental Status: Normal mood and affect. Normal behavior. Normal judgment and thought content.   Assessment and Plan:  Pregnancy: G4P3003 at 2030w4d  1. Supervision of other normal pregnancy, antepartum     Doing well  2. Hx of preeclampsia, prior pregnancy, currently pregnant    Taking baby ASA  3. Rh negative status during pregnancy in third trimester      03/19/17 given.   Preterm labor symptoms and general obstetric precautions including but not limited to vaginal bleeding,  contractions, leaking of fluid and fetal movement were reviewed in detail with the patient. Please refer to After Visit Summary for other counseling recommendations.  Return in about 1 week (around 05/21/2017) for ROB.   Roe Coombsachelle A Denney, CNM

## 2017-05-21 ENCOUNTER — Other Ambulatory Visit (HOSPITAL_COMMUNITY)
Admission: RE | Admit: 2017-05-21 | Discharge: 2017-05-21 | Disposition: A | Discharge status: Home / Self Care | Payer: BLUE CROSS/BLUE SHIELD | Source: Ambulatory Visit | Attending: Certified Nurse Midwife | Admitting: Certified Nurse Midwife

## 2017-05-21 ENCOUNTER — Ambulatory Visit (INDEPENDENT_AMBULATORY_CARE_PROVIDER_SITE_OTHER): Payer: BLUE CROSS/BLUE SHIELD | Admitting: Certified Nurse Midwife

## 2017-05-21 ENCOUNTER — Encounter: Payer: Self-pay | Admitting: Certified Nurse Midwife

## 2017-05-21 VITALS — BP 138/85 | HR 130 | Wt 166.2 lb

## 2017-05-21 DIAGNOSIS — O09293 Supervision of pregnancy with other poor reproductive or obstetric history, third trimester: Secondary | ICD-10-CM

## 2017-05-21 DIAGNOSIS — Z3480 Encounter for supervision of other normal pregnancy, unspecified trimester: Secondary | ICD-10-CM

## 2017-05-21 DIAGNOSIS — Z3483 Encounter for supervision of other normal pregnancy, third trimester: Secondary | ICD-10-CM | POA: Insufficient documentation

## 2017-05-21 DIAGNOSIS — O09893 Supervision of other high risk pregnancies, third trimester: Secondary | ICD-10-CM

## 2017-05-21 DIAGNOSIS — Z6791 Unspecified blood type, Rh negative: Secondary | ICD-10-CM

## 2017-05-21 DIAGNOSIS — O26893 Other specified pregnancy related conditions, third trimester: Secondary | ICD-10-CM

## 2017-05-21 DIAGNOSIS — O360930 Maternal care for other rhesus isoimmunization, third trimester, not applicable or unspecified: Secondary | ICD-10-CM

## 2017-05-21 DIAGNOSIS — O09299 Supervision of pregnancy with other poor reproductive or obstetric history, unspecified trimester: Secondary | ICD-10-CM

## 2017-05-21 NOTE — Progress Notes (Signed)
   PRENATAL VISIT NOTE  Subjective:  Jill Vaughn is a 26 y.o. [phone] at 8057w4d being seen today for ongoing prenatal care.  She is currently monitored for the following issues for this low-risk pregnancy and has Supervision of other normal pregnancy, antepartum; Hx of preeclampsia, prior pregnancy, currently pregnant; PUPP (pruritic urticarial papules and plaques of pregnancy); and Rh negative status during pregnancy on her problem list.  Patient reports no bleeding, no leaking and occasional contractions.  Contractions: Irregular. Vag. Bleeding: None.  Movement: Present. Denies leaking of fluid.   The following portions of the patient's history were reviewed and updated as appropriate: allergies, current medications, past family history, past medical history, past social history, past surgical history and problem list. Problem list updated.  Objective:   Vitals:   05/21/17 1507  BP: 138/85  Pulse: (!) 130  Weight: 166 lb 3.2 oz (75.4 kg)    Fetal Status: Fetal Heart Rate (bpm): 138 Fundal Height: 35 cm Movement: Present  Presentation: Vertex  General:  Alert, oriented and cooperative. Patient is in no acute distress.  Skin: Skin is warm and dry. No rash noted.   Cardiovascular: Normal heart rate noted  Respiratory: Normal respiratory effort, no problems with respiration noted  Abdomen: Soft, gravid, appropriate for gestational age. Pain/Pressure: Present     Pelvic:  Cervical exam performed Dilation: 1 Effacement (%): Thick Station: Ballotable  Extremities: Normal range of motion.  Edema: Trace  Mental Status: Normal mood and affect. Normal behavior. Normal judgment and thought content.   Assessment and Plan:  Pregnancy: G4P3003 at 3657w4d  1. Supervision of other normal pregnancy, antepartum       - Strep Gp B NAA - Cervicovaginal ancillary only  2. Hx of preeclampsia, prior pregnancy, currently pregnant     Slightly elevated today.    3. Rh negative status during  pregnancy in third trimester     Rhogam given 03/19/17.   Preterm labor symptoms and general obstetric precautions including but not limited to vaginal bleeding, contractions, leaking of fluid and fetal movement were reviewed in detail with the patient. Please refer to After Visit Summary for other counseling recommendations.  Return in about 1 week (around 05/28/2017) for ROB.   Roe Coombsachelle A Jakolby Sedivy, CNM

## 2017-05-22 LAB — CERVICOVAGINAL ANCILLARY ONLY
Bacterial vaginitis: NEGATIVE
CANDIDA VAGINITIS: POSITIVE — AB
CHLAMYDIA, DNA PROBE: NEGATIVE
NEISSERIA GONORRHEA: NEGATIVE
Trichomonas: NEGATIVE

## 2017-05-23 ENCOUNTER — Other Ambulatory Visit: Payer: Self-pay | Admitting: Certified Nurse Midwife

## 2017-05-23 DIAGNOSIS — B3731 Acute candidiasis of vulva and vagina: Secondary | ICD-10-CM

## 2017-05-23 DIAGNOSIS — B373 Candidiasis of vulva and vagina: Secondary | ICD-10-CM

## 2017-05-23 DIAGNOSIS — Z3480 Encounter for supervision of other normal pregnancy, unspecified trimester: Secondary | ICD-10-CM

## 2017-05-23 LAB — STREP GP B NAA: Strep Gp B NAA: NEGATIVE

## 2017-05-23 MED ORDER — TERCONAZOLE 0.8 % VA CREA: 1.0000 | TOPICAL_CREAM | Freq: Every day | VAGINAL | 0 refills | Status: DC

## 2017-05-23 MED ORDER — FLUCONAZOLE 150 MG PO TABS: 150.0000 mg | ORAL_TABLET | Freq: Once | ORAL | 0 refills | Status: AC

## 2017-05-28 ENCOUNTER — Ambulatory Visit (INDEPENDENT_AMBULATORY_CARE_PROVIDER_SITE_OTHER): Payer: BLUE CROSS/BLUE SHIELD | Admitting: Certified Nurse Midwife

## 2017-05-28 ENCOUNTER — Other Ambulatory Visit: Payer: Self-pay | Admitting: Certified Nurse Midwife

## 2017-05-28 ENCOUNTER — Ambulatory Visit (HOSPITAL_COMMUNITY)
Admission: RE | Admit: 2017-05-28 | Discharge: 2017-05-28 | Disposition: A | Discharge status: Home / Self Care | Payer: BLUE CROSS/BLUE SHIELD | Source: Ambulatory Visit | Attending: Certified Nurse Midwife | Admitting: Certified Nurse Midwife

## 2017-05-28 VITALS — BP 136/91 | HR 125 | Wt 166.2 lb

## 2017-05-28 DIAGNOSIS — O288 Other abnormal findings on antenatal screening of mother: Secondary | ICD-10-CM | POA: Diagnosis not present

## 2017-05-28 DIAGNOSIS — O163 Unspecified maternal hypertension, third trimester: Secondary | ICD-10-CM

## 2017-05-28 DIAGNOSIS — Z3A36 36 weeks gestation of pregnancy: Secondary | ICD-10-CM | POA: Insufficient documentation

## 2017-05-28 DIAGNOSIS — O368130 Decreased fetal movements, third trimester, not applicable or unspecified: Secondary | ICD-10-CM

## 2017-05-28 NOTE — Progress Notes (Signed)
Patient states the baby has slowed down in movement.

## 2017-05-28 NOTE — Progress Notes (Signed)
   PRENATAL VISIT NOTE  Subjective:  Jill Vaughn is a 26 y.o. [phone] at 749w4d being seen today for ongoing prenatal care.  She is currently monitored for the following issues for this low-risk pregnancy and has Supervision of other normal pregnancy, antepartum; Hx of preeclampsia, prior pregnancy, currently pregnant; PUPP (pruritic urticarial papules and plaques of pregnancy); and Rh negative status during pregnancy on her problem list.  Patient reports no bleeding, no cramping, no leaking and occasional contractions.  Contractions: Irregular. Vag. Bleeding: None.  Movement: (!) Decreased. Denies leaking of fluid.   The following portions of the patient's history were reviewed and updated as appropriate: allergies, current medications, past family history, past medical history, past social history, past surgical history and problem list. Problem list updated.  Objective:   Vitals:   05/28/17 1521  BP: (!) 136/91  Pulse: (!) 125  Weight: 166 lb 3.2 oz (75.4 kg)    Fetal Status: Fetal Heart Rate (bpm): NST; non-reactive   Movement: (!) Decreased     General:  Alert, oriented and cooperative. Patient is in no acute distress.  Skin: Skin is warm and dry. No rash noted.   Cardiovascular: Normal heart rate noted  Respiratory: Normal respiratory effort, no problems with respiration noted  Abdomen: Soft, gravid, appropriate for gestational age. Pain/Pressure: Present     Pelvic:  Cervical exam deferred        Extremities: Normal range of motion.  Edema: None  Mental Status: Normal mood and affect. Normal behavior. Normal judgment and thought content.   NST: noaccels, no decels, moderate variability, Cat. 2 tracing. No contractions on toco.   Assessment and Plan:  Pregnancy: G4P3003 at 6249w4d  1. Elevated blood pressure affecting pregnancy in third trimester, antepartum    No prior hx of PIH, normotensive to this point.  Non-reactive NST - Protein / creatinine ratio, urine - CBC -  Comprehensive metabolic panel - Fetal nonstress test; Future  2. Non-reactive NST (non-stress test)      - US MFM FETAL BPP WO NON STRESS; Future  3. Decreased fetal movements in third trimester, single or unspecified fetus     - US MFM FETAL BPP WO NON STRESS; Future  Preterm labor symptoms and general obstetric precautions including but not limited to vaginal bleeding, contractions, leaking of fluid and fetal movement were reviewed in detail with the patient. Please refer to After Visit Summary for other counseling recommendations.  Return in about 1 week (around 06/04/2017), or ROB, tomorrow nurse visit B/P check if BPP WNL, if B/P elevated schedule IOL for Thursday.   Roe Coombsachelle A Jayana Kotula, CNM

## 2017-05-29 ENCOUNTER — Other Ambulatory Visit: Payer: Self-pay | Admitting: Certified Nurse Midwife

## 2017-05-29 ENCOUNTER — Ambulatory Visit: Payer: BLUE CROSS/BLUE SHIELD

## 2017-05-29 ENCOUNTER — Telehealth (HOSPITAL_COMMUNITY): Payer: Self-pay | Admitting: *Deleted

## 2017-05-29 ENCOUNTER — Encounter (HOSPITAL_COMMUNITY): Payer: Self-pay | Admitting: *Deleted

## 2017-05-29 VITALS — BP 148/83 | HR 109

## 2017-05-29 DIAGNOSIS — Z3480 Encounter for supervision of other normal pregnancy, unspecified trimester: Secondary | ICD-10-CM

## 2017-05-29 LAB — COMPREHENSIVE METABOLIC PANEL
A/G RATIO: 1.2 (ref 1.2–2.2)
ALT: 9 IU/L (ref 0–32)
AST: 11 IU/L (ref 0–40)
Albumin: 3 g/dL — ABNORMAL LOW (ref 3.5–5.5)
Alkaline Phosphatase: 126 IU/L — ABNORMAL HIGH (ref 39–117)
BUN/Creatinine Ratio: 11 (ref 9–23)
BUN: 5 mg/dL — ABNORMAL LOW (ref 6–20)
Bilirubin Total: <0.2 mg/dL (ref 0.0–1.2)
CALCIUM: 8.7 mg/dL (ref 8.7–10.2)
CO2: 19 mmol/L — AB (ref 20–29)
CREATININE: 0.46 mg/dL — AB (ref 0.57–1.00)
Chloride: 101 mmol/L (ref 96–106)
GFR, EST AFRICAN AMERICAN: 160 mL/min/{1.73_m2} (ref >59)
GFR, EST NON AFRICAN AMERICAN: 139 mL/min/{1.73_m2} (ref >59)
GLOBULIN, TOTAL: 2.6 g/dL (ref 1.5–4.5)
Glucose: 71 mg/dL (ref 65–99)
Potassium: 3.9 mmol/L (ref 3.5–5.2)
SODIUM: 136 mmol/L (ref 134–144)
TOTAL PROTEIN: 5.6 g/dL — AB (ref 6.0–8.5)

## 2017-05-29 LAB — CBC
HEMATOCRIT: 32.5 % — AB (ref 34.0–46.6)
HEMOGLOBIN: 10.7 g/dL — AB (ref 11.1–15.9)
MCH: 27.6 pg (ref 26.6–33.0)
MCHC: 32.9 g/dL (ref 31.5–35.7)
MCV: 84 fL (ref 79–97)
Platelets: 282 10*3/uL (ref 150–379)
RBC: 3.87 x10E6/uL (ref 3.77–5.28)
RDW: 15.1 % (ref 12.3–15.4)
WBC: 9.4 10*3/uL (ref 3.4–10.8)

## 2017-05-29 LAB — PROTEIN / CREATININE RATIO, URINE
Creatinine, Urine: 183.6 mg/dL
PROTEIN/CREAT RATIO: 211 mg/g{creat} — AB (ref 0–200)
Protein, Ur: 38.7 mg/dL

## 2017-05-29 NOTE — Progress Notes (Signed)
Pt here for a blood pressure check. [facility] and had elevated bp. Bp today is 148/83 pulse 109. Allowed pt to sit for a few minutes, and rechecked. Second check was 131/84 pulse 114. Results reviewed with Rachelle. Induction scheduled for 7/4. Pt informed

## 2017-05-29 NOTE — Telephone Encounter (Signed)
Preadmission screen  

## 2017-05-30 ENCOUNTER — Other Ambulatory Visit: Payer: Self-pay | Admitting: Advanced Practice Midwife

## 2017-06-02 ENCOUNTER — Inpatient Hospital Stay (HOSPITAL_COMMUNITY)
Admission: RE | Admit: 2017-06-02 | Discharge: 2017-06-04 | DRG: 775 | Disposition: A | Discharge status: Home / Self Care | Payer: Medicaid Other | Source: Ambulatory Visit | Attending: Obstetrics & Gynecology | Admitting: Obstetrics & Gynecology

## 2017-06-02 ENCOUNTER — Encounter (HOSPITAL_COMMUNITY): Payer: Self-pay

## 2017-06-02 VITALS — BP 125/75 | HR 96 | Temp 98.2°F | Resp 18 | Ht 60.0 in | Wt 166.0 lb

## 2017-06-02 DIAGNOSIS — Z6791 Unspecified blood type, Rh negative: Secondary | ICD-10-CM | POA: Diagnosis not present

## 2017-06-02 DIAGNOSIS — O26893 Other specified pregnancy related conditions, third trimester: Secondary | ICD-10-CM | POA: Diagnosis present

## 2017-06-02 DIAGNOSIS — Z3A37 37 weeks gestation of pregnancy: Secondary | ICD-10-CM

## 2017-06-02 DIAGNOSIS — O133 Gestational [pregnancy-induced] hypertension without significant proteinuria, third trimester: Secondary | ICD-10-CM

## 2017-06-02 DIAGNOSIS — O139 Gestational [pregnancy-induced] hypertension without significant proteinuria, unspecified trimester: Secondary | ICD-10-CM | POA: Diagnosis present

## 2017-06-02 DIAGNOSIS — O134 Gestational [pregnancy-induced] hypertension without significant proteinuria, complicating childbirth: Principal | ICD-10-CM | POA: Diagnosis present

## 2017-06-02 DIAGNOSIS — Z880 Allergy status to penicillin: Secondary | ICD-10-CM | POA: Diagnosis not present

## 2017-06-02 LAB — CBC
HCT: 33.8 % — ABNORMAL LOW (ref 36.0–46.0)
HEMATOCRIT: 30.9 % — AB (ref 36.0–46.0)
Hemoglobin: 9.9 g/dL — ABNORMAL LOW (ref 12.0–15.0)
Hemoglobin: 10.7 g/dL — ABNORMAL LOW (ref 12.0–15.0)
MCH: 26.8 pg (ref 26.0–34.0)
MCH: 26.6 pg (ref 26.0–34.0)
MCHC: 32 g/dL (ref 30.0–36.0)
MCHC: 31.7 g/dL (ref 30.0–36.0)
MCV: 83.5 fL (ref 78.0–100.0)
MCV: 83.9 fL (ref 78.0–100.0)
PLATELETS: 293 10*3/uL (ref 150–400)
Platelets: 264 10*3/uL (ref 150–400)
RBC: 3.7 MIL/uL — ABNORMAL LOW (ref 3.87–5.11)
RBC: 4.03 MIL/uL (ref 3.87–5.11)
RDW: 15.4 % (ref 11.5–15.5)
RDW: 15.3 % (ref 11.5–15.5)
WBC: 7.7 10*3/uL (ref 4.0–10.5)
WBC: 13.4 10*3/uL — ABNORMAL HIGH (ref 4.0–10.5)

## 2017-06-02 LAB — TYPE AND SCREEN
ABO/RH(D): A NEG
Antibody Screen: NEGATIVE

## 2017-06-02 LAB — COMPREHENSIVE METABOLIC PANEL
ALK PHOS: 104 U/L (ref 38–126)
ALT: 10 U/L — ABNORMAL LOW (ref 14–54)
ANION GAP: 9 (ref 5–15)
AST: 15 U/L (ref 15–41)
Albumin: 2.3 g/dL — ABNORMAL LOW (ref 3.5–5.0)
BILIRUBIN TOTAL: 0.2 mg/dL — AB (ref 0.3–1.2)
CALCIUM: 8.3 mg/dL — AB (ref 8.9–10.3)
CO2: 21 mmol/L — ABNORMAL LOW (ref 22–32)
Chloride: 105 mmol/L (ref 101–111)
Creatinine, Ser: 0.41 mg/dL — ABNORMAL LOW (ref 0.44–1.00)
GFR calc Af Amer: >60 mL/min (ref >60)
GFR calc non Af Amer: >60 mL/min (ref >60)
GLUCOSE: 88 mg/dL (ref 65–99)
Potassium: 3.3 mmol/L — ABNORMAL LOW (ref 3.5–5.1)
Sodium: 135 mmol/L (ref 135–145)
TOTAL PROTEIN: 5.8 g/dL — AB (ref 6.5–8.1)

## 2017-06-02 LAB — RPR: RPR Ser Ql: NONREACTIVE

## 2017-06-02 MED ORDER — FENTANYL 2.5 MCG/ML BUPIVACAINE 1/10 % EPIDURAL INFUSION (WH - ANES)
INTRAMUSCULAR | Status: AC
  Filled 2017-06-02: qty 100

## 2017-06-02 MED ORDER — MISOPROSTOL 25 MCG QUARTER TABLET: 25.0000 ug | ORAL_TABLET | ORAL | Status: DC | PRN

## 2017-06-02 MED ORDER — SOD CITRATE-CITRIC ACID 500-334 MG/5ML PO SOLN: 30.0000 mL | ORAL | Status: DC | PRN

## 2017-06-02 MED ORDER — TERBUTALINE SULFATE 1 MG/ML IJ SOLN
0.2500 mg | Freq: Once | INTRAMUSCULAR | Status: DC | PRN
  Filled 2017-06-02: qty 1

## 2017-06-02 MED ORDER — OXYTOCIN 40 UNITS IN LACTATED RINGERS INFUSION - SIMPLE MED
1.0000 m[IU]/min | INTRAVENOUS | Status: DC
  Administered 2017-06-02: 2 m[IU]/min via INTRAVENOUS

## 2017-06-02 MED ORDER — PHENYLEPHRINE 40 MCG/ML (10ML) SYRINGE FOR IV PUSH (FOR BLOOD PRESSURE SUPPORT)
PREFILLED_SYRINGE | INTRAVENOUS | Status: AC
  Filled 2017-06-02: qty 10

## 2017-06-02 MED ORDER — ACETAMINOPHEN 325 MG PO TABS: 650.0000 mg | ORAL_TABLET | ORAL | Status: DC | PRN

## 2017-06-02 MED ORDER — OXYCODONE-ACETAMINOPHEN 5-325 MG PO TABS: 1.0000 | ORAL_TABLET | ORAL | Status: DC | PRN

## 2017-06-02 MED ORDER — LACTATED RINGERS IV SOLN
INTRAVENOUS | Status: DC
  Administered 2017-06-02 (×2): 125 mL/h via INTRAVENOUS

## 2017-06-02 MED ORDER — OXYTOCIN BOLUS FROM INFUSION: 500.0000 mL | Freq: Once | INTRAVENOUS | Status: DC

## 2017-06-02 MED ORDER — ONDANSETRON HCL 4 MG/2ML IJ SOLN: 4.0000 mg | Freq: Four times a day (QID) | INTRAMUSCULAR | Status: DC | PRN

## 2017-06-02 MED ORDER — OXYTOCIN 40 UNITS IN LACTATED RINGERS INFUSION - SIMPLE MED
2.5000 [IU]/h | INTRAVENOUS | Status: DC
  Administered 2017-06-02: 2.5 [IU]/h via INTRAVENOUS
  Filled 2017-06-02: qty 1000

## 2017-06-02 MED ORDER — TERBUTALINE SULFATE 1 MG/ML IJ SOLN
0.2500 mg | Freq: Once | INTRAMUSCULAR | Status: DC | PRN
Start: 2017-06-02 — End: 2017-06-03
  Filled 2017-06-02: qty 1

## 2017-06-02 MED ORDER — FENTANYL CITRATE (PF) 100 MCG/2ML IJ SOLN
100.0000 ug | INTRAMUSCULAR | Status: DC | PRN
  Administered 2017-06-02: 100 ug via INTRAVENOUS
  Filled 2017-06-02: qty 2

## 2017-06-02 MED ORDER — LIDOCAINE HCL (PF) 1 % IJ SOLN
30.0000 mL | INTRAMUSCULAR | Status: DC | PRN
  Filled 2017-06-02 (×2): qty 30

## 2017-06-02 MED ORDER — IBUPROFEN 600 MG PO TABS
600.0000 mg | ORAL_TABLET | Freq: Four times a day (QID) | ORAL | Status: DC
  Administered 2017-06-02 – 2017-06-04 (×7): 600 mg via ORAL
  Filled 2017-06-02 (×7): qty 1

## 2017-06-02 MED ORDER — LACTATED RINGERS IV SOLN: 500.0000 mL | INTRAVENOUS | Status: DC | PRN

## 2017-06-02 MED ORDER — OXYCODONE-ACETAMINOPHEN 5-325 MG PO TABS: 2.0000 | ORAL_TABLET | ORAL | Status: DC | PRN

## 2017-06-02 NOTE — Progress Notes (Signed)
Labor Progress Note  S: [id number] 480w2d here for IOL for gestational HTN. Patient resting comfortably in bed. Pain 4/10, does not require epidural at this time. No headache, blurred vision, RUQ pain, edema.   O:  BP 135/89 (BP Location: Left Arm)   Pulse 94   Temp 98.4 F (36.9 C) (Oral)   Resp 16   Ht 5' (1.524 m)   Wt 75.3 kg (166 lb)   LMP 09/14/2016   BMI 32.42 kg/m   FHT Baseline 135, mod variability, accelerations present, no decels  CVE:  Dilation: 4 Effacement (%): 80 Cervical Position: Middle Station: -2 Presentation: Vertex Exam by:: Doloris HallJenny Middleton RN   A&P: 26 y.o. [id number] 6480w2d  FWB Category 1 Continue Pitocin for IOL Expectant management Plan for epidural as needed Anticipate SVD   Jeanie CooksSarah Daniel Ritthaler, Medical Student 5:21 PM

## 2017-06-02 NOTE — Progress Notes (Signed)
Labor Progress Note  S: Patient comfortably ambulating around room. Pain 1/10. No muscle weakness, headache, blurred vision, RUQ pain, edema.  O:  BP 123/72 (BP Location: Right Arm)   Pulse 92   Temp 98.5 F (36.9 C) (Oral)   Resp 16   Ht 5' (1.524 m)   Wt 75.3 kg (166 lb)   LMP 09/14/2016   BMI 32.42 kg/m    FHT baseline 140, mod variability, accels present, no decels  CVE: Dilation: 2 Effacement (%): 50 Cervical Position: Posterior Station: -2 Presentation: Vertex Exam by:: Doloris HallJenny Middleton RN   A&P: 26 y.o. [id number] 6472w2d FWB Category 1 Continue expectant management Epidural if necessary Anticipate SVD   Jeanie CooksSarah Nicosha Struve, Medical Student 1:03 PM

## 2017-06-02 NOTE — Progress Notes (Signed)
Labor Progress Note  Rosezella FloridaSamantha E Deboy is a 26 y.o. [phone] at 6456w2d  admitted for induction of labor due to Hypertension in pregnancy. Patient seen and examined for progress of labor. Currently comfortable and without complaints.  O:  BP 131/76   Pulse 88   Temp 98.4 F (36.9 C) (Oral)   Resp 18   Ht 5' (1.524 m)   Wt 75.3 kg (166 lb)   LMP 09/14/2016   BMI 32.42 kg/m   FHT: 145 bmp, mod var, accels, no decels UC:  [id number]  SVE:   Dilation: 5 Effacement (%): 60 Station: -2 Exam by:: MD Genevie AnnSchenk   Pitocin @ 16 mu/min  Labs: Lab Results  Component Value Date   WBC 7.7 06/02/2017   HGB 9.9 (L) 06/02/2017   HCT 30.9 (L) 06/02/2017   MCV 83.5 06/02/2017   PLT 264 06/02/2017    Assessment / Plan: Labor: progressing normally. Continue to inc pit per protocol Fetal Wellbeing:  Category I Pain Control:  Epidural on request Anticipated MOD:  NSVD  Expectant management  Howard PouchLauren Mirenda Baltazar, MD PGY-2 [facility]

## 2017-06-02 NOTE — Anesthesia Pain Management Evaluation Note (Signed)
  CRNA Pain Management Visit Note  Patient: Jill FloridaSamantha E Boyan, 26 y.o., female  "Hello I am a [facility] the [facility]. We have an anesthesia team available at all times to provide care throughout the hospital, including epidural management and anesthesia for C-section. I don't know your plan for the delivery whether it a natural birth, water birth, IV sedation, nitrous supplementation, doula or epidural, but we want to meet your pain goals."   1.Was your pain managed to your expectations on prior hospitalizations?   Yes   2.What is your expectation for pain management during this hospitalization?     Labor support without medications  3.How can we help you reach that goal? unsure  Record the patient's initial score and the patient's pain goal.   Pain: 0  Pain Goal: 8 [facility] wants you to be able to say your pain was always managed very well.  Cephus ShellingBURGER,Jmya Uliano 06/02/2017

## 2017-06-02 NOTE — H&P (Signed)
LABOR ADMISSION HISTORY AND PHYSICAL  Jill Vaughn is a 26 y.o. female [phone] with IUP at 6753w2d by LMP presenting for IOL gestational HTN. History of preeclampsia, eclampsia, and gestational DM in prior pregnancies. Has been normotensive this pregnancy, was taking ASA 81 mg until 36 weeks. She reports +FMs, contractions for the last two weeks. No leakage of fluids, vaginal bleeding. No blurry vision, headaches, peripheral edema, or RUQ pain. She plans on breast feeding. She request BTL for birth control, consent forms signed 04/16/17.  Dating: By LMP --->  Estimated Date of Delivery: 06/21/17  Sono:   @[redacted]w[redacted]d , CWD, normal anatomy, cephalic presentation, placenta fundal, above cervical os, 34% EFW   Prenatal History/Complications:  Past Medical History: Past Medical History:  Diagnosis Date  . Kidney stones   . No pertinent past medical history   . Pregnancy induced hypertension     Past Surgical History: Past Surgical History:  Procedure Laterality Date  . MOUTH SURGERY      Obstetrical History: OB History    Gravida Para Term Preterm AB Living   4 3 3     3    SAB TAB Ectopic Multiple Live Births         0 3      Social History: Social History   Social History  . Marital status: Married    Spouse name: N/A  . Number of children: N/A  . Years of education: N/A   Social History Main Topics  . Smoking status: Never Smoker  . Smokeless tobacco: Never Used  . Alcohol use No  . Drug use: No  . Sexual activity: Yes    Birth control/ protection: None   Other Topics Concern  . Not on file   Social History Narrative  . No narrative on file    Family History: Family History  Problem Relation Age of Onset  . Cancer Mother   . Heart disease Mother   . Diabetes Mother   . Diabetes Maternal Uncle   . Cancer Maternal Grandmother     Allergies: Allergies  Allergen Reactions  . Augmentin [Amoxicillin-Pot Clavulanate] Nausea And Vomiting    Has patient had a PCN  reaction causing immediate rash, facial/tongue/throat swelling, SOB or lightheadedness with hypotension: No Has patient had a PCN reaction causing severe rash involving mucus membranes or skin necrosis: No Has patient had a PCN reaction that required hospitalization No Has patient had a PCN reaction occurring within the last 10 years: No If all of the above answers are "NO", then may proceed with Cephalosporin use.     Prescriptions Prior to Admission  Medication Sig Dispense Refill Last Dose  . aspirin 81 MG chewable tablet Chew 1 tablet (81 mg total) by mouth daily. (Patient not taking: Reported on 05/28/2017) 30 tablet PRN Not Taking  . Prenatal Vit-Fe Fumarate-FA (PRENATAL MULTIVITAMIN) TABS tablet Take 1 tablet by mouth daily.    Taking  . ranitidine (ZANTAC) 150 MG tablet Take 150 mg by mouth 2 (two) times daily.   Taking     Review of Systems   All systems reviewed and negative except as stated in HPI  Blood pressure 120/83, pulse 96, temperature 98.5 F (36.9 C), temperature source Oral, resp. rate 16, height 5' (1.524 m), weight 75.3 kg (166 lb), last menstrual period 09/14/2016, unknown if currently breastfeeding. General appearance: alert, cooperative and no distress Presentation: cephalic Fetal monitoring: Baseline: 145 bpm, Variability: Good {> 6 bpm), Accelerations: Reactive and Decelerations: Absent Uterine activity: Frequency:  Every 3-5 minutes  Dilation: 2 Effacement (%): 50 Cervical Position: Posterior Station: -2 Presentation: Vertex Exam by:: Doloris Hall RN  Prenatal labs: ABO, Rh: A/Negative/-- (12/20 1640) Antibody: Negative (12/20 1640) Rubella: Immune RPR: Non Reactive (04/23 1125)  HBsAg: Negative (12/20 1640)  HIV: Non Reactive (04/23 1125)  GBS: Negative (06/25 1544)  1 hr Glucola: 150, normal Genetic screening: Negative  Anatomy US: Normal  Prenatal Transfer Tool  Maternal Diabetes: No Genetic Screening: Normal Maternal  Ultrasounds/Referrals: Normal Fetal Ultrasounds or other Referrals:  None Maternal Substance Abuse:  No Significant Maternal Medications:  None Significant Maternal Lab Results: Lab values include: Group B Strep negative, Rh negative  Results for orders placed or performed during the hospital encounter of 06/02/17 (from the past 24 hour(s))  CBC   Collection Time: 06/02/17  8:50 AM  Result Value Ref Range   WBC 7.7 4.0 - 10.5 K/uL   RBC 3.70 (L) 3.87 - 5.11 MIL/uL   Hemoglobin 9.9 (L) 12.0 - 15.0 g/dL   HCT 16.1 (L) 09.6 - 04.5 %   MCV 83.5 78.0 - 100.0 fL   MCH 26.8 26.0 - 34.0 pg   MCHC 32.0 30.0 - 36.0 g/dL   RDW 40.9 81.1 - 91.4 %   Platelets 264 150 - 400 K/uL    Patient Active Problem List   Diagnosis Date Noted  . Gestational hypertension 06/02/2017  . Rh negative status during pregnancy 04/30/2017  . PUPP (pruritic urticarial papules and plaques of pregnancy) 04/02/2017  . Supervision of other normal pregnancy, antepartum 11/15/2016  . Hx of preeclampsia, prior pregnancy, currently pregnant 11/15/2016    Assessment: Jill Vaughn is a 26 y.o. [phone] at [redacted]w[redacted]d here for IOL for gestational HTN.  #Labor: Pitocin started for IOL at 7:45. Continue expectant management, anticipate SVD. #Pain: Epidural PRN #FWB: Category 1 #ID: GBS negative #MOF: Breast #MOC: BTL, consent forms signed 04/16/17 #Circ: NA   Jeanie Cooks, Medical Student 06/02/2017, 9:26 AM   OB FELLOW HISTORY AND PHYSICAL ATTESTATION  I confirm that I have verified the information documented in the medical student's note and that I have also personally reperformed the physical exam and all medical decision making activities.      Ernestina Penna 06/02/2017, 1:34 PM

## 2017-06-03 MED ORDER — ONDANSETRON HCL 4 MG/2ML IJ SOLN: 4.0000 mg | INTRAMUSCULAR | Status: DC | PRN

## 2017-06-03 MED ORDER — SIMETHICONE 80 MG PO CHEW: 80.0000 mg | CHEWABLE_TABLET | ORAL | Status: DC | PRN

## 2017-06-03 MED ORDER — DIPHENHYDRAMINE HCL 25 MG PO CAPS: 25.0000 mg | ORAL_CAPSULE | Freq: Four times a day (QID) | ORAL | Status: DC | PRN

## 2017-06-03 MED ORDER — ONDANSETRON HCL 4 MG PO TABS: 4.0000 mg | ORAL_TABLET | ORAL | Status: DC | PRN

## 2017-06-03 MED ORDER — WITCH HAZEL-GLYCERIN EX PADS
1.0000 "application " | MEDICATED_PAD | CUTANEOUS | Status: DC | PRN
  Administered 2017-06-04: 1 via TOPICAL

## 2017-06-03 MED ORDER — SENNOSIDES-DOCUSATE SODIUM 8.6-50 MG PO TABS
2.0000 | ORAL_TABLET | ORAL | Status: DC
  Administered 2017-06-03 (×2): 2 via ORAL
  Filled 2017-06-03 (×2): qty 2

## 2017-06-03 MED ORDER — TETANUS-DIPHTH-ACELL PERTUSSIS 5-2.5-18.5 LF-MCG/0.5 IM SUSP: 0.5000 mL | Freq: Once | INTRAMUSCULAR | Status: DC

## 2017-06-03 MED ORDER — RHO D IMMUNE GLOBULIN 1500 UNIT/2ML IJ SOSY
300.0000 ug | PREFILLED_SYRINGE | Freq: Once | INTRAMUSCULAR | Status: AC
  Administered 2017-06-03: 300 ug via INTRAVENOUS
  Filled 2017-06-03: qty 2

## 2017-06-03 MED ORDER — DIBUCAINE 1 % RE OINT: 1.0000 "application " | TOPICAL_OINTMENT | RECTAL | Status: DC | PRN

## 2017-06-03 MED ORDER — BENZOCAINE-MENTHOL 20-0.5 % EX AERO: 1.0000 "application " | INHALATION_SPRAY | CUTANEOUS | Status: DC | PRN

## 2017-06-03 MED ORDER — ACETAMINOPHEN 325 MG PO TABS: 650.0000 mg | ORAL_TABLET | ORAL | Status: DC | PRN

## 2017-06-03 MED ORDER — COCONUT OIL OIL: 1.0000 "application " | TOPICAL_OIL | Status: DC | PRN

## 2017-06-03 MED ORDER — ZOLPIDEM TARTRATE 5 MG PO TABS: 5.0000 mg | ORAL_TABLET | Freq: Every evening | ORAL | Status: DC | PRN

## 2017-06-03 MED ORDER — PRENATAL MULTIVITAMIN CH
1.0000 | ORAL_TABLET | Freq: Every day | ORAL | Status: DC
  Administered 2017-06-03 – 2017-06-04 (×2): 1 via ORAL
  Filled 2017-06-03 (×2): qty 1

## 2017-06-03 NOTE — Lactation Note (Signed)
This note was copied from a baby's chart. Lactation Consultation Note Mom's 4th child. Has 2 older children, she didn't BF her first child, her 2nd child for a months or so, then her 3rd child for 2 months which is now 4814 months old. Mom stated her milk hasn't dried up from her 814 months old. Has had milk since she finished BF June 2017. Asked mom to hand express. Mom has pendulum soft breast, large areola w/semi flat nipple that everts well w/finger stimulation. Mom unable to hand express colostrum. Lc asked mom if LC can demonstrate how LC does it, mom agreed. LC hand expressed several times w/clear colostrum noted.  Reviewed care and behavior of a baby less than 6lbs and at 37 2/7 weeks. Mom encouraged to feed baby 8-12 times/24 hours and with feeding cues. Mom encouraged to waken baby for feeds. Discussed cluster feeding, I&O, STS, supply and demand.  Discussed supplementing baby, preferably colostrum first, then formula. Alimentum given w/slow flow nipple. Baby slow to take, had uncoordinated suckle. Took 4 ml. reviewed supplementing information sheet according to hours of age.  Mom shown how to use DEBP & how to disassemble, clean, & reassemble parts. Mom knows to pump q3h for 15-20 min. Mom tired, states she will pump after resting.  Mom has pacifier in bed, discussed use of pacifier and discouraged at least 2 weeks.  WH/LC brochure given w/resources, support groups and LC services.  Patient Name: Girl Rosezella FloridaSamantha E Berg EAVWU'JToday's Date: 06/03/2017 Reason for consult: Initial assessment   Maternal Data Has patient been taught Hand Expression?: Yes Does the patient have breastfeeding experience prior to this delivery?: Yes  Feeding Feeding Type: Formula Nipple Type: Slow - flow  LATCH Score/Interventions       Type of Nipple: Everted at rest and after stimulation (short shaft)  Comfort (Breast/Nipple): Soft / non-tender           Lactation Tools Discussed/Used Tools: Pump Breast  pump type: Double-Electric Breast Pump WIC Program: No Pump Review: Setup, frequency, and cleaning;Milk Storage Initiated by:: Peri JeffersonL. Nechuma Boven RN IBCLC Date initiated:: 06/03/17   Consult Status Consult Status: Follow-up Date: 06/03/17 Follow-up type: In-patient    Lacey Wallman, Diamond NickelLAURA G 06/03/2017, 5:22 AM

## 2017-06-03 NOTE — Progress Notes (Signed)
POSTPARTUM PROGRESS NOTE  Post Partum Day #1 SVD  Subjective:  Jill Vaughn is a 26 y.o. [id number] 2865w2d s/p SVD.  No acute events overnight.  Pt denies problems with ambulating, voiding or po intake.  She denies nausea or vomiting.  Pain is well controlled.  She has had flatus. She has had bowel movement.  Lochia Minimal.   Objective: Blood pressure 113/73, pulse (!) 57, temperature 98.5 F (36.9 C), temperature source Oral, resp. rate 18, height 5' (1.524 m), weight 75.3 kg (166 lb), last menstrual period 09/14/2016, SpO2 99 %, unknown if currently breastfeeding.  Physical Exam:  General: alert, cooperative and no distress Lochia:normal flow Uterine Fundus: firm DVT Evaluation: No calf swelling or tenderness Extremities: no LE edema   Recent Labs  06/02/17 0850 06/02/17 2143  HGB 9.9* 10.7*  HCT 30.9* 33.8*    Assessment/Plan:  ASSESSMENT: Jill FloridaSamantha E Dade is a 26 y.o. [id number] 5865w2d s/p SVD  Plan for discharge tomorrow   LOS: 1 day   Howard PouchLauren Feng, MD PGY-2 [facility]  06/03/2017, 7:04 AM   OB FELLOW POSTPARTUM PROGRESS NOTE ATTESTATION  I confirm that I have verified the information documented in the resident's note and that I have also personally reperformed the physical exam and all medical decision making activities.     Ernestina PennaNicholas Laray Corbit, MD 9:31 AM

## 2017-06-04 ENCOUNTER — Encounter (HOSPITAL_COMMUNITY): Payer: Self-pay

## 2017-06-04 LAB — RH IG WORKUP (INCLUDES ABO/RH)
ABO/RH(D): A NEG
Fetal Screen: NEGATIVE
GESTATIONAL AGE(WKS): 37.2
UNIT DIVISION: 0

## 2017-06-04 MED ORDER — IBUPROFEN 600 MG PO TABS: 600.0000 mg | ORAL_TABLET | Freq: Four times a day (QID) | ORAL | 0 refills | Status: DC | PRN

## 2017-06-04 NOTE — Discharge Summary (Signed)
OB Discharge Summary     Patient Name: Jill Vaughn DOB: 1991/03/20 MRN: [id number]  Date of admission: 06/02/2017 Delivering MD: Howard Pouch   Date of discharge: 06/04/2017  Admitting diagnosis: 37wks induction Intrauterine pregnancy: [redacted]w[redacted]d     Secondary diagnosis:  Active Problems:   Gestational hypertension  Additional problems: Rh negative     Discharge diagnosis: Term Pregnancy Delivered and Gestational Hypertension                                                                                                Post partum procedures:rhogam  Augmentation: AROM and Pitocin  Complications: None  Hospital course:  Induction of Labor With Vaginal Delivery   26 y.o. yo [phone] at [redacted]w[redacted]d was admitted to the hospital 06/02/2017 for induction of labor.  Indication for induction: Gestational hypertension.  Patient had an uncomplicated labor course as follows: Membrane Rupture Time/Date: 5:41 PM ,06/02/2017   Intrapartum Procedures: Episiotomy: None [1]                                         Lacerations:  None [1]  Patient had delivery of a Viable infant.  Information for the patient's newborn:  Seryna, Marek Girl Eldoris Beiser [[id number]]  Delivery Method: Vag-Spont   06/02/2017  Details of delivery can be found in separate delivery note.  Patient had a routine postpartum course. Patient is discharged home 06/04/17.  Physical exam  Vitals:   06/03/17 0525 06/03/17 1328 06/03/17 1912 06/04/17 0523  BP: 113/73 135/88 129/81 125/75  Pulse: (!) 57 87 81 96  Resp: 18 18 18 18   Temp: 98.5 F (36.9 C) 97.9 F (36.6 C) 98.2 F (36.8 C) 98.2 F (36.8 C)  TempSrc: Oral Oral Oral Oral  SpO2:  99%    Weight:      Height:       General: alert and cooperative Lochia: appropriate Uterine Fundus: firm Incision: N/A DVT Evaluation: No evidence of DVT seen on physical exam. Labs: Lab Results  Component Value Date   WBC 13.4 (H) 06/02/2017   HGB 10.7 (L) 06/02/2017   HCT 33.8 (L) 06/02/2017    MCV 83.9 06/02/2017   PLT 293 06/02/2017   CMP Latest Ref Rng & Units 06/02/2017  Glucose 65 - 99 mg/dL 88  BUN 6 - 20 mg/dL <2(G)  Creatinine 4.01 - 1.00 mg/dL 0.27(O)  Sodium 536 - 644 mmol/L 135  Potassium 3.5 - 5.1 mmol/L 3.3(L)  Chloride 101 - 111 mmol/L 105  CO2 22 - 32 mmol/L 21(L)  Calcium 8.9 - 10.3 mg/dL 8.3(L)  Total Protein 6.5 - 8.1 g/dL 0.3(K)  Total Bilirubin 0.3 - 1.2 mg/dL 7.4(Q)  Alkaline Phos 38 - 126 U/L 104  AST 15 - 41 U/L 15  ALT 14 - 54 U/L 10(L)    Discharge instruction: per After Visit Summary and "Baby and Me Booklet".  After visit meds:  Allergies as of 06/04/2017      Reactions   Augmentin [amoxicillin-pot Clavulanate] Nausea  And Vomiting   Has patient had a PCN reaction causing immediate rash, facial/tongue/throat swelling, SOB or lightheadedness with hypotension: No Has patient had a PCN reaction causing severe rash involving mucus membranes or skin necrosis: No Has patient had a PCN reaction that required hospitalization No Has patient had a PCN reaction occurring within the last 10 years: No If all of the above answers are "NO", then may proceed with Cephalosporin use.      Medication List    STOP taking these medications   aspirin 81 MG chewable tablet   ranitidine 150 MG tablet Commonly known as:  ZANTAC     TAKE these medications   ibuprofen 600 MG tablet Commonly known as:  ADVIL,MOTRIN Take 1 tablet (600 mg total) by mouth every 6 (six) hours as needed.   prenatal multivitamin Tabs tablet Take 1 tablet by mouth daily.       Diet: routine diet  Activity: Advance as tolerated. Pelvic rest for 6 weeks.   Outpatient follow up:Baby Love BP check in 1 weeks; pp visit in 4 weeks Follow up Appt:No future appointments. Follow up Visit:No Follow-up on file.  Postpartum contraception: IUD Mirena (had decided against BTL)  Newborn Data: Live born female  Birth Weight: 5 lb 15.2 oz (2700 g) APGAR: 9, 9  Baby Feeding:  Breast Disposition:home with mother   06/04/2017 Cam HaiSHAW, Chabely Norby, CNM  7:27 AM

## 2017-06-04 NOTE — Lactation Note (Addendum)
This note was copied from a baby's chart. Lactation Consultation Note  Patient Name: Jill Rosezella FloridaSamantha E Kaczmarczyk BJYNW'GToday's Date: 06/04/2017   Mother states she was discouraged not to breastfeed because "they are worried about the baby". So she states now she is only bottle feeding. Encouraged her to pump to protect her milk supply, give her pumped breastmilk and the difference with formula. Mother has personal pump at home. Mother has volume guidelines. Reviewed engorgement care and monitoring voids/stools.       Maternal Data    Feeding Feeding Type: Formula Nipple Type: Regular  LATCH Score/Interventions                      Lactation Tools Discussed/Used     Consult Status      Hardie PulleyBerkelhammer, Amunique Neyra Boschen 06/04/2017, 10:48 AM

## 2017-06-04 NOTE — Plan of Care (Signed)
Problem: Nutritional: Goal: Mothers verbalization of comfort with breastfeeding process will improve Outcome: Completed/Met Date Met: 06/04/17 Pt reports that, d/t frustration and difficulty with latching, she no longer desires to put the baby to the breast.  RN offered to help but Pt declined.  Pt reports that her plan is to pump and bottle feed.  Frequency of pumping discussed.  Pt reports she has a pump at home.  Engorgement and mastitis discussed. Pt verbalized understanding.

## 2017-06-04 NOTE — Discharge Instructions (Signed)

## 2017-06-05 ENCOUNTER — Encounter: Payer: BLUE CROSS/BLUE SHIELD | Admitting: Certified Nurse Midwife

## 2017-06-06 NOTE — Progress Notes (Signed)
Post discharge chart review completed.  

## 2017-06-13 ENCOUNTER — Encounter: Payer: BLUE CROSS/BLUE SHIELD | Admitting: Certified Nurse Midwife

## 2017-06-18 ENCOUNTER — Encounter: Payer: BLUE CROSS/BLUE SHIELD | Admitting: Certified Nurse Midwife

## 2017-07-02 ENCOUNTER — Encounter: Payer: Self-pay | Admitting: *Deleted

## 2017-07-02 ENCOUNTER — Encounter: Payer: Self-pay | Admitting: Certified Nurse Midwife

## 2017-07-02 ENCOUNTER — Ambulatory Visit (INDEPENDENT_AMBULATORY_CARE_PROVIDER_SITE_OTHER): Payer: BLUE CROSS/BLUE SHIELD | Admitting: Certified Nurse Midwife

## 2017-07-02 VITALS — BP 121/78 | HR 97 | Wt 151.5 lb

## 2017-07-02 DIAGNOSIS — Z30430 Encounter for insertion of intrauterine contraceptive device: Secondary | ICD-10-CM | POA: Diagnosis not present

## 2017-07-02 DIAGNOSIS — Z1389 Encounter for screening for other disorder: Secondary | ICD-10-CM | POA: Diagnosis not present

## 2017-07-02 DIAGNOSIS — Z3202 Encounter for pregnancy test, result negative: Secondary | ICD-10-CM

## 2017-07-02 DIAGNOSIS — Z30014 Encounter for initial prescription of intrauterine contraceptive device: Secondary | ICD-10-CM

## 2017-07-02 LAB — POCT URINE PREGNANCY: PREG TEST UR: NEGATIVE

## 2017-07-02 MED ORDER — LEVONORGESTREL 19.5 MG IU IUD
1.0000 [IU] | INTRAUTERINE_SYSTEM | Freq: Once | INTRAUTERINE | Status: AC
  Administered 2017-07-02: 1 [IU] via INTRAUTERINE

## 2017-07-02 NOTE — Progress Notes (Signed)
IUD Procedure Note   DIAGNOSIS: Desires long-term, reversible contraception   PROCEDURE: IUD placement Performing Provider: Orvilla Cornwallachelle Erek Kowal CNM  Patient counseled prior to procedure. I explained risks and benefits of Kyleena IUD, reviewed alternative forms of contraception. Patient stated understanding and consented to continue with procedure.   LMP: unknown Pregnancy Test: Negative Lot #: [id number] Expiration Date: Nov 2019   IUD type: [   ] Mirena   [   ] Paragard  [    ] Lyletta   [X]   Kyleena  PROCEDURE:  Timeout procedure was performed to ensure right patient and right site.  A bimanual exam was performed to determine the position of the uterus, retroverted. The speculum was placed. The vagina and cervix was sterilized in the usual manner and sterile technique was maintained throughout the course of the procedure. A single toothed tenaculum was applied to the posterior lip of the cervix and gentle traction applied. The depth of the uterus was sounded to 12 cm. With gentle traction on the tenaculum, the IUD was inserted to the appropriate depth and inserted without difficulty.  The string was cut to an estimated 4 cm length. Bleeding was minimal. The patient tolerated the procedure well.   Follow up: The patient tolerated the procedure well without complications.  Standard post-procedure care is explained and return precautions are given.  Orvilla Cornwallachelle Gustie Bobb CNM

## 2017-07-02 NOTE — Progress Notes (Signed)
Post Partum Exam  Jill Vaughn is a 26 y.o. [phone] female who presents for a postpartum visit. She is 4 weeks postpartum following a spontaneous vaginal delivery. I have fully reviewed the prenatal and intrapartum course. The delivery was at 37.2 gestational weeks.  Anesthesia: none. Postpartum course  BP has been normal. Baby's course has been normal. Baby is feeding by bottle - Similac Advance. Bleeding no bleeding. Bowel function is normal. Bladder function is normal. Patient is not sexually active. Contraception method is abstinence. Postpartum depression screening:neg  The following portions of the patient's history were reviewed and updated as appropriate: allergies, current medications, past family history, past medical history, past social history, past surgical history and problem list.  Review of Systems Pertinent items noted in HPI and remainder of comprehensive ROS otherwise negative.    Objective:  unknown if currently breastfeeding.  General:  alert, cooperative and no distress   Breasts:  inspection negative, no nipple discharge or bleeding, no masses or nodularity palpable  Lungs: clear to auscultation bilaterally  Heart:  regular rate and rhythm, S1, S2 normal, no murmur, click, rub or gallop  Abdomen: soft, non-tender; bowel sounds normal; no masses,  no organomegaly   Vulva:  normal  Vagina: normal vagina, no discharge, exudate, lesion, or erythema  Cervix:  no cervical motion tenderness  Corpus: normal size, contour, position, consistency, mobility, non-tender  Adnexa:  not evaluated  Rectal Exam: Not performed.        Assessment:    Normal 4 week postpartum exam. Pap smear not done at today's visit.   Pap: 11/15/16: normal Plan:   1. Contraception: abstinence, IUD placed today 2. F/u 1 mo string check

## 2017-07-31 ENCOUNTER — Ambulatory Visit (INDEPENDENT_AMBULATORY_CARE_PROVIDER_SITE_OTHER): Payer: BLUE CROSS/BLUE SHIELD | Admitting: Certified Nurse Midwife

## 2017-07-31 ENCOUNTER — Encounter: Payer: Self-pay | Admitting: Certified Nurse Midwife

## 2017-07-31 VITALS — BP 112/80 | HR 92 | Wt 155.0 lb

## 2017-07-31 DIAGNOSIS — Z30431 Encounter for routine checking of intrauterine contraceptive device: Secondary | ICD-10-CM | POA: Diagnosis not present

## 2017-07-31 NOTE — Progress Notes (Signed)
Subjective:    Jill Vaughn  who presents for contraception counseling. The patient has no complaints today. The patient is sexually active. Pertinent past medical history: none.  Likes her Jill Vaughn.  Is not currently bleeding with the Vaughn.  States that she bled for about 2 weeks after insertion, nothing since.  Does report cramping about once a week.  Has not checked for the strings. Denies any pain with sexual intercourse.   The information documented in the HPI was reviewed and verified.  Menstrual History: OB History    Gravida Para Term Preterm AB Living   1 1 1     1    SAB TAB Ectopic Multiple Live Births         0 1      No LMP recorded. has Jill Vaughn   Patient Active Problem List   Diagnosis Date Noted  There are no active problems to display for this patient.   No past medical history on file.  Past Surgical History:  Procedure Laterality Date   Past Surgical History:  Procedure Laterality Date  . MOUTH SURGERY        Current Outpatient Prescriptions:  No medication comments found. Current Outpatient Prescriptions on File Prior to Visit  Medication Sig Dispense Refill  . ibuprofen (ADVIL,MOTRIN) 600 MG tablet Take 1 tablet (600 mg total) by mouth every 6 (six) hours as needed. (Patient not taking: Reported on 07/02/2017) 30 tablet 0  . Prenatal Vit-Fe Fumarate-FA (PRENATAL MULTIVITAMIN) TABS tablet Take 1 tablet by mouth daily.      No current facility-administered medications on file prior to visit.     Allergies  Allergen Reactions   Allergies  Allergen Reactions  . Augmentin [Amoxicillin-Pot Clavulanate] Nausea And Vomiting    Has patient had a PCN reaction causing immediate rash, facial/tongue/throat swelling, SOB or lightheadedness with hypotension: No Has patient had a PCN reaction causing severe rash involving mucus membranes or skin necrosis: No Has patient had a PCN reaction that required hospitalization No Has patient had a PCN reaction  occurring within the last 10 years: No If all of the above answers are "NO", then may proceed with Cephalosporin use.      Social History  Substance Use Topics   Social History   Social History  . Marital status: Married    Spouse name: N/A  . Number of children: N/A  . Years of education: N/A   Occupational History  . Not on file.   Social History Main Topics  . Smoking status: Never Smoker  . Smokeless tobacco: Never Used  . Alcohol use No  . Drug use: No  . Sexual activity: Not Currently    Partners: Male    Birth control/ protection: None, Abstinence   Other Topics Concern  . Not on file   Social History Narrative  . No narrative on file     No family history on file.     Review of Systems Constitutional: negative for weight loss Genitourinary:negative for abnormal menstrual periods and vaginal discharge   Objective:   BP 115/77   Pulse 89   Wt 161 lb (73 kg)   BMI 25.22 kg/m    General:   alert  Skin:   no rash or abnormalities  Lungs:   clear to auscultation bilaterally  Heart:   regular rate and rhythm, S1, S2 normal, no murmur, click, rub or gallop  Breasts:   deferred  Abdomen:  normal findings: no organomegaly, soft, non-tender  and no hernia  Pelvis:  External genitalia: normal general appearance Urinary system: urethral meatus normal and bladder without fullness, nontender Vaginal: normal without tenderness, induration or masses Cervix: normal appearance, Vaughn strings present Adnexa: normal bimanual exam Uterus: anteverted and non-tender, normal size   Lab Review Urine pregnancy test Labs reviewed yes Radiologic studies reviewed no  50% of 15 min visit spent on counseling and coordination of care.    Assessment:    26 y.o., continuing Vaughn, no contraindications.   Plan:    All questions answered.   would consider [place] at a later date if still having cramping.    Need to obtain previous records Follow up as needed or in 4 months  for annual exam after 11/16/17.

## 2017-07-31 NOTE — Progress Notes (Signed)
Pt states she is having some lower pelvic pain.

## 2018-07-17 IMAGING — US US MFM FETAL BPP W/O NON-STRESS
1 series · 14 of 25 positions shown · non-contrast
Comparison: none

[Series 1: us mfm fetal bpp w/o non-stress · 25 acquisitions, 14 frames shown]
[im 1/25]
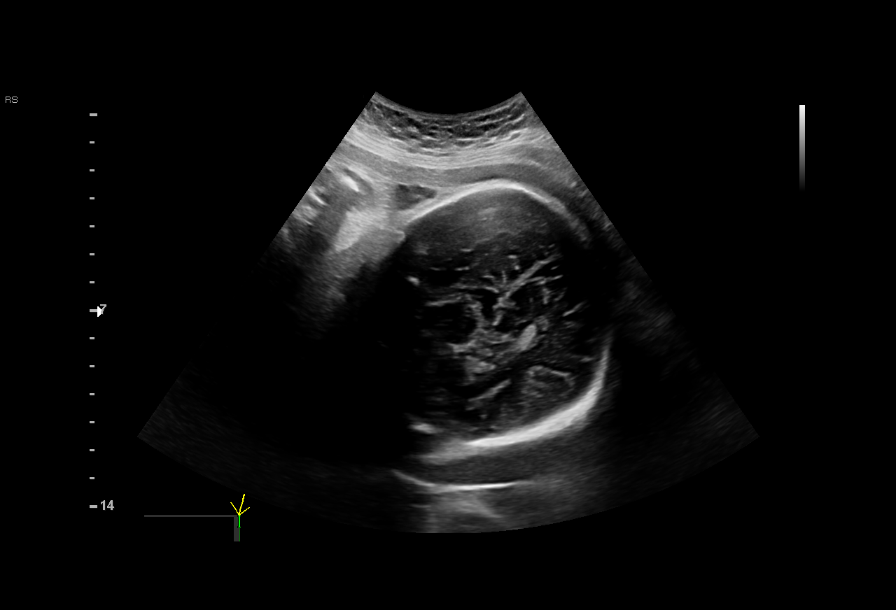
[im 3/25]
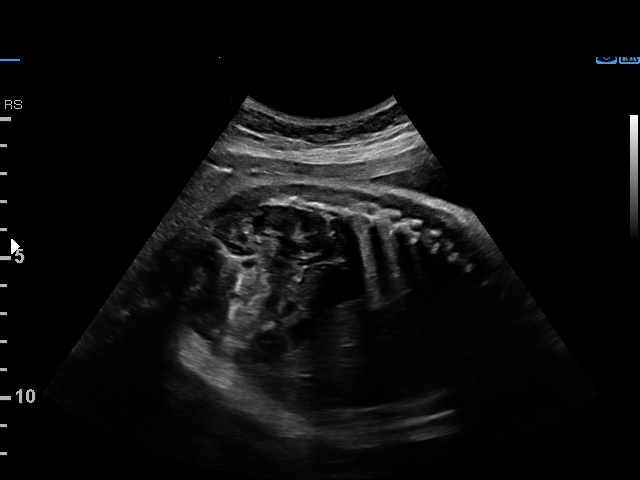
[im 5/25]
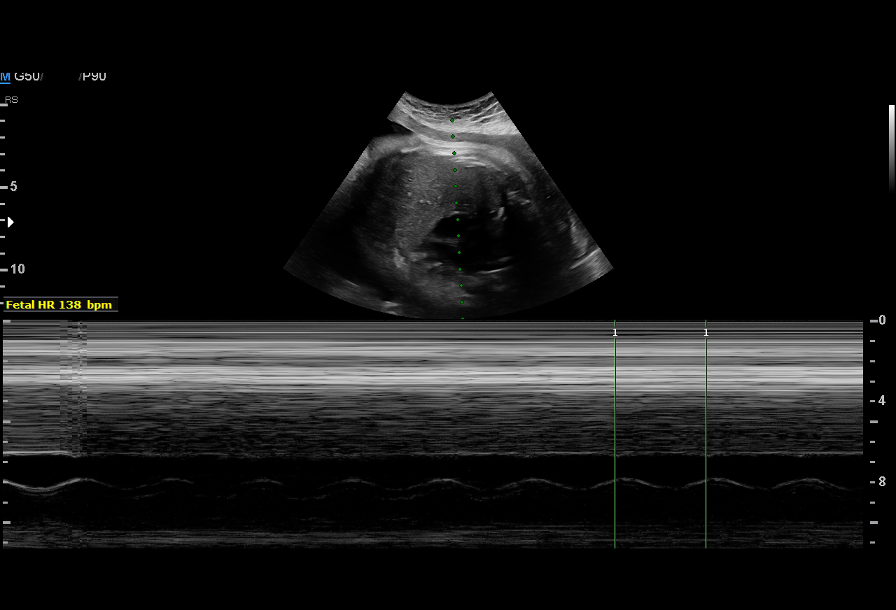
[im 7/25]
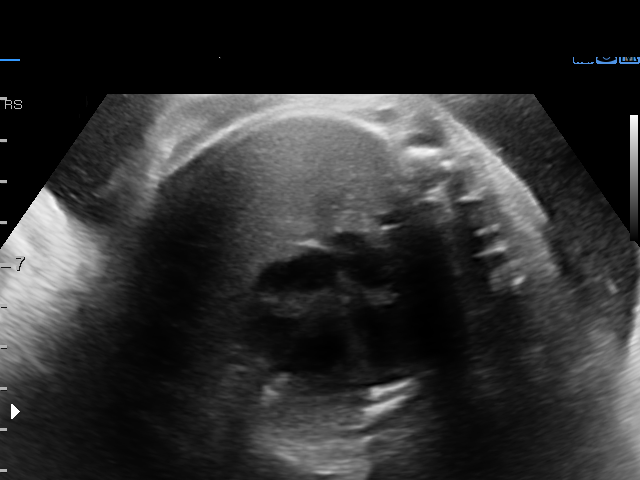
[im 9/25]
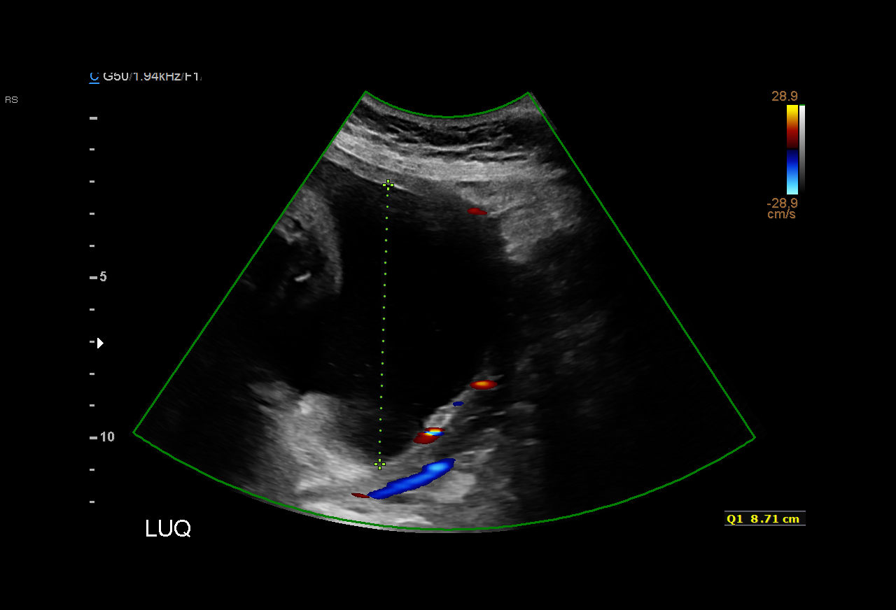
[im 10/25]
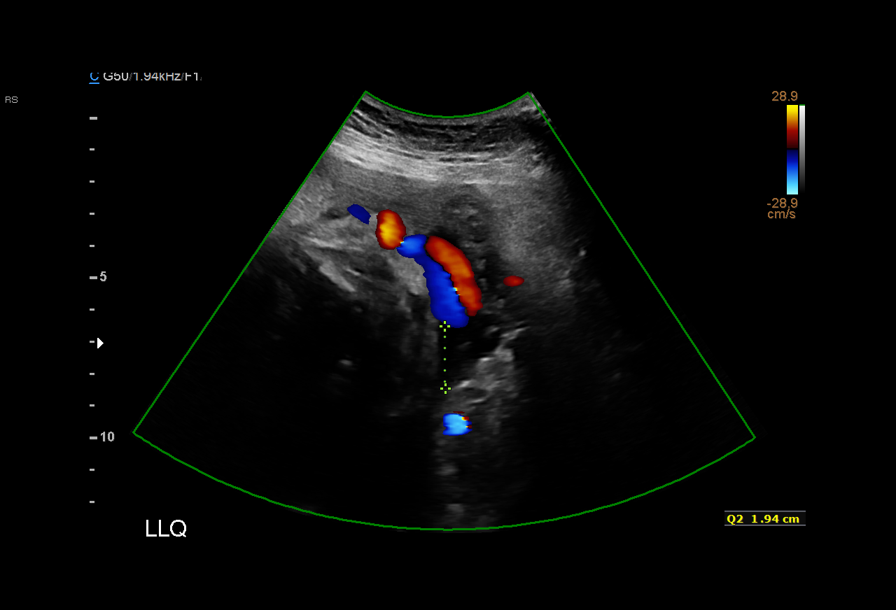
[im 12/25]
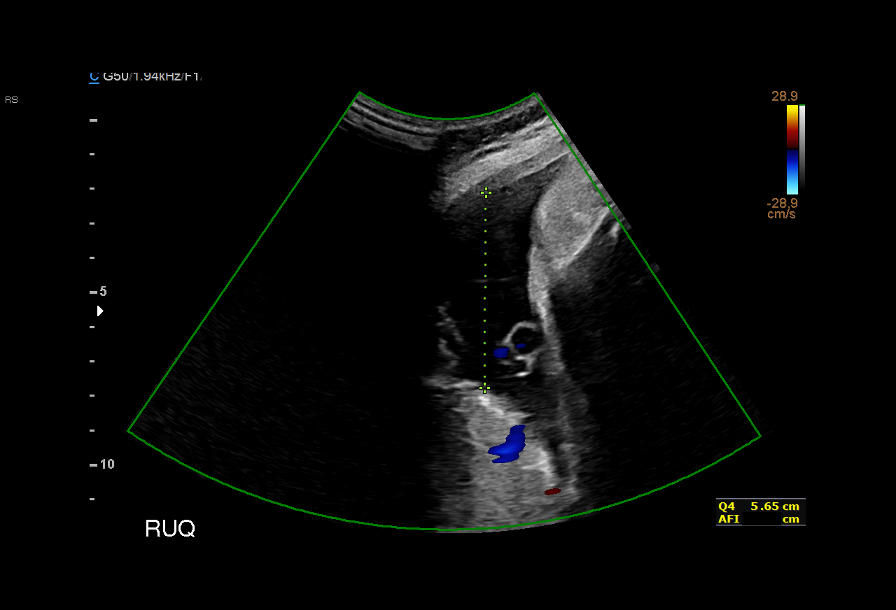
[im 14/25]
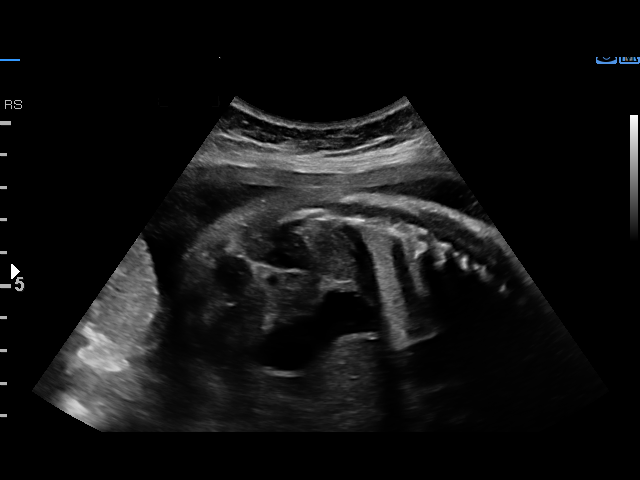
[im 16/25]
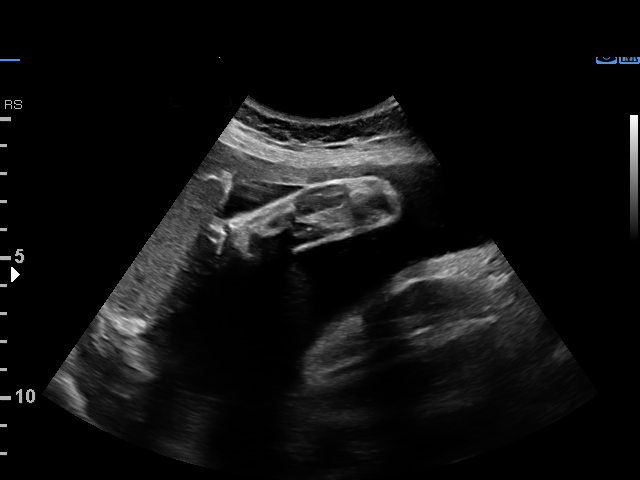
[im 17/25]
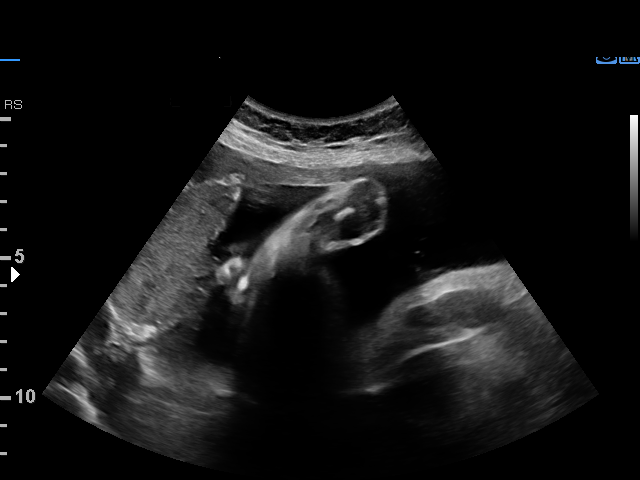
[im 19/25]
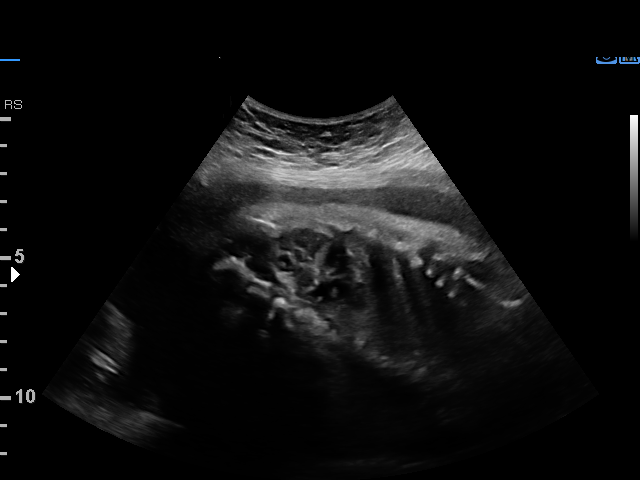
[im 21/25]
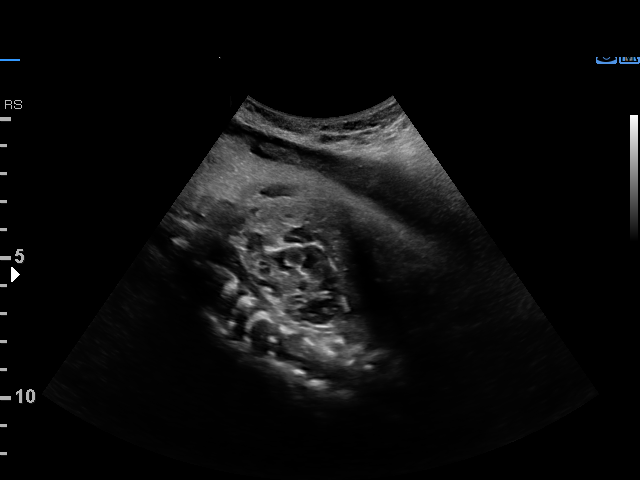
[im 23/25]
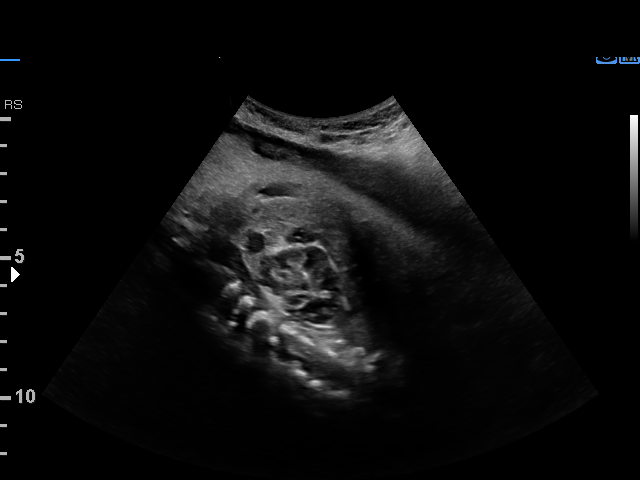
[im 25/25]
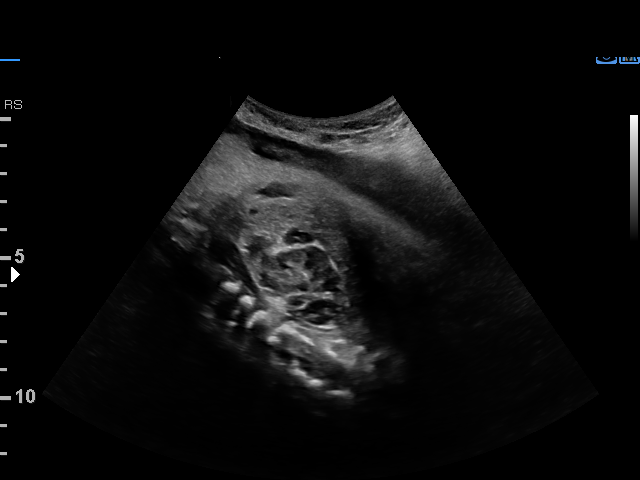

[14 of 25 positions shown; findings below may reference images not displayed]

([HOSPITAL])
[REDACTED] [HOSPITAL]

Indications

36 weeks gestation of pregnancy
OB History

Blood Type:            Height:  5'0"   Weight (lb):  150       BMI:
Gravidity:    4         Term:   3        Prem:   0        SAB:   0
TOP:          0       Ectopic:  0        Living: 3
Fetal Evaluation

Num Of Fetuses:     1
Fetal Heart         138
Rate(bpm):
Cardiac Activity:   Observed
Presentation:       Cephalic

Amniotic Fluid
AFI FV:      Subjectively within normal limits

AFI Sum(cm)     %Tile       Largest Pocket(cm)
19.62           75

RUQ(cm)       RLQ(cm)       LUQ(cm)        LLQ(cm)
8.71
Biophysical Evaluation

Amniotic F.V:   Pocket => 2 cm two         F. Tone:        Observed
planes
F. Movement:    Observed                   Score:          [DATE]
F. Breathing:   Observed
Gestational Age

LMP:           36w 4d        Date:  09/14/16                 EDD:   06/21/17
Best:          36w 4d     Det. By:  LMP  (09/14/16)          EDD:   06/21/17
Impression

IUP at  36+4 weeks with nonreactive NST
Normal amniotic fluid volume
BPP [DATE]
Recommendations

Follow-up ultrasounds as clinically indicated.

## 2023-10-31 ENCOUNTER — Encounter (HOSPITAL_COMMUNITY): Payer: Self-pay | Admitting: Pharmacy Technician

## 2023-10-31 ENCOUNTER — Emergency Department (HOSPITAL_COMMUNITY)
Admission: EM | Admit: 2023-10-31 | Discharge: 2023-11-01 | Disposition: A | Discharge status: Home / Self Care | Payer: Medicaid Other | Attending: Emergency Medicine | Admitting: Emergency Medicine

## 2023-10-31 ENCOUNTER — Emergency Department (HOSPITAL_COMMUNITY): Payer: Medicaid Other

## 2023-10-31 ENCOUNTER — Other Ambulatory Visit: Payer: Self-pay

## 2023-10-31 DIAGNOSIS — J189 Pneumonia, unspecified organism: Secondary | ICD-10-CM

## 2023-10-31 DIAGNOSIS — J181 Lobar pneumonia, unspecified organism: Secondary | ICD-10-CM | POA: Insufficient documentation

## 2023-10-31 DIAGNOSIS — Z20822 Contact with and (suspected) exposure to covid-19: Secondary | ICD-10-CM | POA: Insufficient documentation

## 2023-10-31 DIAGNOSIS — R059 Cough, unspecified: Secondary | ICD-10-CM | POA: Diagnosis present

## 2023-10-31 LAB — CBC WITH DIFFERENTIAL/PLATELET
Abs Immature Granulocytes: 0.02 10*3/uL (ref 0.00–0.07)
Basophils Absolute: 0 10*3/uL (ref 0.0–0.1)
Basophils Relative: 0 %
Eosinophils Absolute: 0 10*3/uL (ref 0.0–0.5)
Eosinophils Relative: 0 %
HCT: 41.8 % (ref 36.0–46.0)
Hemoglobin: 14.2 g/dL (ref 12.0–15.0)
Immature Granulocytes: 0 %
Lymphocytes Relative: 14 %
Lymphs Abs: 0.9 10*3/uL (ref 0.7–4.0)
MCH: 30.1 pg (ref 26.0–34.0)
MCHC: 34 g/dL (ref 30.0–36.0)
MCV: 88.7 fL (ref 80.0–100.0)
Monocytes Absolute: 0.6 10*3/uL (ref 0.1–1.0)
Monocytes Relative: 9 %
Neutro Abs: 5.1 10*3/uL (ref 1.7–7.7)
Neutrophils Relative %: 77 %
Platelets: 241 10*3/uL (ref 150–400)
RBC: 4.71 MIL/uL (ref 3.87–5.11)
RDW: 12.2 % (ref 11.5–15.5)
WBC: 6.7 10*3/uL (ref 4.0–10.5)
nRBC: 0 % (ref 0.0–0.2)

## 2023-10-31 LAB — BASIC METABOLIC PANEL
Anion gap: 8 (ref 5–15)
BUN: 9 mg/dL (ref 6–20)
CO2: 22 mmol/L (ref 22–32)
Calcium: 8.6 mg/dL — ABNORMAL LOW (ref 8.9–10.3)
Chloride: 104 mmol/L (ref 98–111)
Creatinine, Ser: 0.88 mg/dL (ref 0.44–1.00)
GFR, Estimated: >60 mL/min (ref >60)
Glucose, Bld: 103 mg/dL — ABNORMAL HIGH (ref 70–99)
Potassium: 3.5 mmol/L (ref 3.5–5.1)
Sodium: 134 mmol/L — ABNORMAL LOW (ref 135–145)

## 2023-10-31 LAB — GROUP A STREP BY PCR: Group A Strep by PCR: NOT DETECTED

## 2023-10-31 LAB — RESP PANEL BY RT-PCR (RSV, FLU A&B, COVID)  RVPGX2
Influenza A by PCR: NEGATIVE
Influenza B by PCR: NEGATIVE
Resp Syncytial Virus by PCR: NEGATIVE
SARS Coronavirus 2 by RT PCR: NEGATIVE

## 2023-10-31 MED ORDER — ACETAMINOPHEN 325 MG PO TABS
650.0000 mg | ORAL_TABLET | Freq: Once | ORAL | Status: AC | PRN
  Administered 2023-10-31: 650 mg via ORAL
  Filled 2023-10-31: qty 2

## 2023-10-31 NOTE — ED Triage Notes (Signed)
Pt here with generalized body aches, fevers, chills, cough, headache sore throat and wheezing onset yesterday.

## 2023-10-31 NOTE — ED Provider Triage Note (Signed)
Emergency Medicine Provider Triage Evaluation Note  Jill Vaughn , a 32 y.o. female  was evaluated in triage.  Pt complains of cough, congestion, rhinorrhea, sore throat.  Recently exposed to someone who had similar symptoms.  Fevers and chills at home as well.  Review of Systems  Positive: As above Negative: As above  Physical Exam  BP (!) 136/94   Pulse (!) 120   Temp (!) 100.4 F (38 C) (Oral)   Resp 18   SpO2 98%  Gen:   Awake, no distress   Resp:  Normal effort  MSK:   Moves extremities without difficulty  Other:  Posterior pharyngeal erythema  Medical Decision Making  Medically screening exam initiated at 5:37 PM.  Appropriate orders placed.  Jill Vaughn was informed that the remainder of the evaluation will be completed by another provider, this initial triage assessment does not replace that evaluation, and the importance of remaining in the ED until their evaluation is complete.  Workup initiated   Jill Vaughn 10/31/23 1737

## 2023-10-31 NOTE — ED Notes (Signed)
Triage rn notified of temp 

## 2023-11-01 MED ORDER — DOXYCYCLINE HYCLATE 100 MG PO TABS
100.0000 mg | ORAL_TABLET | Freq: Once | ORAL | Status: AC
Start: 2023-11-01 — End: 2023-11-01
  Administered 2023-11-01: 100 mg via ORAL
  Filled 2023-11-01: qty 1

## 2023-11-01 MED ORDER — DOXYCYCLINE HYCLATE 100 MG PO CAPS: 100.0000 mg | ORAL_CAPSULE | Freq: Two times a day (BID) | ORAL | 0 refills | Status: AC

## 2023-11-01 NOTE — ED Provider Notes (Signed)
[facility] EM[facility] DEPARTMENT AT [facility] Provider Note   CSN: [id number] Arrival date & time: 10/31/23  1635     History  Chief Complaint - cough   Jill Vaughn is a 32 y.o. female.  The history is provided by the patient.  Patient is otherwise healthy at baseline. Over 5 days ago she began having headache, body aches and chills.  She reports she began having cough and sore throat.  Denies hemoptysis.  She has chest pain with cough.  No shortness of breath, denies dyspnea on exertion.  She is a non-smoker.  She has no history of lung disease disease No recent foreign travel  She has had sick contacts  Home Medications Prior to Admission medications   Medication Sig Start Date End Date Taking? Authorizing Provider  doxycycline (VIBRAMYCIN) 100 MG capsule Take 1 capsule (100 mg total) by mouth 2 (two) times daily. 11/01/23  Yes Zadie Rhine, MD      Allergies    Augmentin [amoxicillin-pot clavulanate]    Review of Systems   Review of Systems  Constitutional:  Positive for fatigue and fever.  Respiratory:  Positive for cough. Negative for shortness of breath.     Physical Exam Updated Vital Signs BP 116/83 (BP Location: Right Arm)   Pulse 99   Temp 98.3 F (36.8 C)   Resp 17   SpO2 99%  Physical Exam CONSTITUTIONAL: Well developed/well nourished HEAD: Normocephalic/atraumatic EYES: EOMI/PERRL ENMT: Mucous membranes moist, uvula midline, no erythema or exudate, no stridor NECK: supple no meningeal signs CV: S1/S2 noted, no murmurs/rubs/gallops noted LUNGS: Right basilar crackles are noted, otherwise lungs are clear NEURO: Pt is awake/alert/appropriate, moves all extremitiesx4.  No facial droop.  Patient ambulated without difficulty SKIN: warm, color normal PSYCH: no abnormalities of mood noted, alert and oriented to situation  ED Results / Procedures / Treatments   Labs (all labs ordered are listed, but only abnormal results are  displayed) Labs Reviewed  BASIC METABOLIC PANEL - Abnormal; Notable for the following components:      Result Value   Sodium 134 (*)    Glucose, Bld 103 (*)    Calcium 8.6 (*)    All other components within normal limits  RESP PANEL BY RT-PCR (RSV, FLU A&B, COVID)  RVPGX2  GROUP A STREP BY PCR  CBC WITH DIFFERENTIAL/PLATELET    EKG None  Radiology DG Chest 2 View  Result Date: 10/31/2023 CLINICAL DATA:  Cough for several days. Mediastinal chest pain. Shortness of breath EXAM: CHEST - 2 VIEW COMPARISON:  None Available. FINDINGS: Dense consolidation in the right middle lobe likely representing pneumonia. Follow-up to resolution is recommended to exclude underlying obstructing endobronchial process. Left lung is clear. Heart size and pulmonary vascularity are normal. No pleural effusions. No pneumothorax. IMPRESSION: Dense consolidation in the right middle lobe is likely pneumonia. Electronically Signed   By: Burman Nieves M.D.   On: 10/31/2023 19:26    Procedures Procedures    Medications Ordered in ED Medications  doxycycline (VIBRA-TABS) tablet 100 mg (has no administration in time range)  acetaminophen (TYLENOL) tablet 650 mg (650 mg Oral Given 10/31/23 2336)    ED Course/ Medical Decision Making/ A&P Clinical Course as of 11/01/23 0319  Thu Nov 01, 2023  0318 Labs overall unremarkable.  X-ray consistent with right middle lobe pneumonia.  Patient is very healthy at baseline and is safe for outpatient management.  She has no hypoxia and no dyspnea on exertion  Discussed need  to rest, and antibiotics as an outpatient.  Reports she is intolerant to Augmentin, therefore will give doxycycline.  We discussed strict return precautions.  Discussed need to have outpatient x-ray with a PCP [DW]    Clinical Course User Index [DW] Zadie Rhine, MD                      PORT Score: 22            Medical Decision Making Risk OTC drugs. Prescription drug  management.   This patient presents to the ED for concern of cough, this involves an extensive number of treatment options, and is a complaint that carries with it a high risk of complications and morbidity.  The differential diagnosis includes but is not limited to COVID-19, influenza, viral URI, asthma exacerbation, pulmonary embolism, pneumonia   Social Determinants of Health: Patient's impaired access to primary care  increases the complexity of managing their presentation  Additional history obtained: Additional history obtained from spouse  Lab Tests: I Ordered, and personally interpreted labs.  The pertinent results include: Labs are unremarkable  Imaging Studies ordered: I ordered imaging studies including X-ray chest   I independently visualized and interpreted imaging which showed pneumonia I agree with the radiologist interpretation  Medicines ordered and prescription drug management: I ordered medication including doxycycline for pneumonia   Test Considered: Patient is healthy at baseline, defer further workup, does not require admission   Complexity of problems addressed: Patient's presentation is most consistent with  acute presentation with potential threat to life or bodily function  Disposition: After consideration of the diagnostic results and the patient's response to treatment,  I feel that the patent would benefit from discharge   .           Final Clinical Impression(s) / ED Diagnoses Final diagnoses:  Community acquired pneumonia of right middle lobe of lung    Rx / DC Orders ED Discharge Orders          Ordered    doxycycline (VIBRAMYCIN) 100 MG capsule  2 times daily        11/01/23 0316              Zadie Rhine, MD 11/01/23 [phone]

## 2023-11-01 NOTE — Discharge Instructions (Addendum)
You will need a repeat x-ray in about 1 month
